# Patient Record
Sex: Female | Born: 1950 | Race: White | Hispanic: No | Marital: Married | Smoking: Former smoker
Health system: Southern US, Community
[De-identification: ages and names within clinical notes are randomized; demographics above are authoritative.]

## PROBLEM LIST (undated history)

## (undated) DIAGNOSIS — I499 Cardiac arrhythmia, unspecified: Secondary | ICD-10-CM

## (undated) DIAGNOSIS — I10 Essential (primary) hypertension: Secondary | ICD-10-CM

## (undated) DIAGNOSIS — J449 Chronic obstructive pulmonary disease, unspecified: Secondary | ICD-10-CM

## (undated) DIAGNOSIS — M1990 Unspecified osteoarthritis, unspecified site: Secondary | ICD-10-CM

## (undated) DIAGNOSIS — K219 Gastro-esophageal reflux disease without esophagitis: Secondary | ICD-10-CM

## (undated) DIAGNOSIS — M722 Plantar fascial fibromatosis: Secondary | ICD-10-CM

## (undated) DIAGNOSIS — C801 Malignant (primary) neoplasm, unspecified: Secondary | ICD-10-CM

## (undated) HISTORY — PX: TONSILLECTOMY: SUR1361

## (undated) HISTORY — PX: COLONOSCOPY: SHX174

## (undated) HISTORY — DX: Plantar fascial fibromatosis: M72.2

---

## 1999-01-07 ENCOUNTER — Other Ambulatory Visit: Admission: RE | Admit: 1999-01-07 | Discharge: 1999-01-07 | Payer: Self-pay | Admitting: Family Medicine

## 1999-02-21 ENCOUNTER — Ambulatory Visit (HOSPITAL_COMMUNITY): Admission: RE | Admit: 1999-02-21 | Discharge: 1999-02-21 | Payer: Self-pay | Admitting: Family Medicine

## 1999-02-21 ENCOUNTER — Encounter: Payer: Self-pay | Admitting: Family Medicine

## 2001-03-23 ENCOUNTER — Encounter (INDEPENDENT_AMBULATORY_CARE_PROVIDER_SITE_OTHER): Payer: Self-pay | Admitting: *Deleted

## 2001-03-23 ENCOUNTER — Ambulatory Visit (HOSPITAL_BASED_OUTPATIENT_CLINIC_OR_DEPARTMENT_OTHER): Admission: RE | Admit: 2001-03-23 | Discharge: 2001-03-23 | Payer: Self-pay | Admitting: *Deleted

## 2002-01-31 ENCOUNTER — Ambulatory Visit (HOSPITAL_BASED_OUTPATIENT_CLINIC_OR_DEPARTMENT_OTHER): Admission: RE | Admit: 2002-01-31 | Discharge: 2002-01-31 | Payer: Self-pay | Admitting: Plastic Surgery

## 2004-01-02 ENCOUNTER — Other Ambulatory Visit: Admission: RE | Admit: 2004-01-02 | Discharge: 2004-01-02 | Payer: Self-pay | Admitting: Family Medicine

## 2004-05-21 ENCOUNTER — Ambulatory Visit (HOSPITAL_COMMUNITY): Admission: RE | Admit: 2004-05-21 | Discharge: 2004-05-21 | Payer: Self-pay | Admitting: Family Medicine

## 2005-01-21 ENCOUNTER — Ambulatory Visit (HOSPITAL_COMMUNITY): Admission: RE | Admit: 2005-01-21 | Discharge: 2005-01-21 | Payer: Self-pay | Admitting: Gastroenterology

## 2005-01-21 ENCOUNTER — Encounter (INDEPENDENT_AMBULATORY_CARE_PROVIDER_SITE_OTHER): Payer: Self-pay | Admitting: Specialist

## 2005-03-31 ENCOUNTER — Other Ambulatory Visit: Admission: RE | Admit: 2005-03-31 | Discharge: 2005-03-31 | Payer: Self-pay | Admitting: Family Medicine

## 2005-05-26 ENCOUNTER — Ambulatory Visit (HOSPITAL_COMMUNITY): Admission: RE | Admit: 2005-05-26 | Discharge: 2005-05-26 | Payer: Self-pay | Admitting: Endocrinology

## 2006-04-25 ENCOUNTER — Emergency Department (HOSPITAL_COMMUNITY): Admission: EM | Admit: 2006-04-25 | Discharge: 2006-04-25 | Payer: Self-pay | Admitting: Family Medicine

## 2006-06-22 ENCOUNTER — Other Ambulatory Visit: Admission: RE | Admit: 2006-06-22 | Discharge: 2006-06-22 | Payer: Self-pay | Admitting: Family Medicine

## 2006-08-05 ENCOUNTER — Ambulatory Visit (HOSPITAL_COMMUNITY): Admission: RE | Admit: 2006-08-05 | Discharge: 2006-08-05 | Payer: Self-pay | Admitting: Family Medicine

## 2007-09-06 ENCOUNTER — Ambulatory Visit (HOSPITAL_COMMUNITY): Admission: RE | Admit: 2007-09-06 | Discharge: 2007-09-06 | Payer: Self-pay | Admitting: Family Medicine

## 2007-10-05 ENCOUNTER — Other Ambulatory Visit: Admission: RE | Admit: 2007-10-05 | Discharge: 2007-10-05 | Payer: Self-pay | Admitting: Family Medicine

## 2008-09-06 ENCOUNTER — Ambulatory Visit (HOSPITAL_COMMUNITY): Admission: RE | Admit: 2008-09-06 | Discharge: 2008-09-06 | Payer: Self-pay | Admitting: Family Medicine

## 2008-10-09 ENCOUNTER — Other Ambulatory Visit: Admission: RE | Admit: 2008-10-09 | Discharge: 2008-10-09 | Payer: Self-pay | Admitting: Family Medicine

## 2009-06-18 ENCOUNTER — Ambulatory Visit (HOSPITAL_BASED_OUTPATIENT_CLINIC_OR_DEPARTMENT_OTHER): Admission: RE | Admit: 2009-06-18 | Discharge: 2009-06-18 | Payer: Self-pay | Admitting: Plastic Surgery

## 2009-10-17 ENCOUNTER — Ambulatory Visit (HOSPITAL_COMMUNITY): Admission: RE | Admit: 2009-10-17 | Discharge: 2009-10-17 | Payer: Self-pay | Admitting: Family Medicine

## 2009-12-12 ENCOUNTER — Other Ambulatory Visit: Admission: RE | Admit: 2009-12-12 | Discharge: 2009-12-12 | Payer: Self-pay | Admitting: Family Medicine

## 2010-02-10 HISTORY — PX: MENISCUS REPAIR: SHX5179

## 2010-04-30 LAB — POCT I-STAT, CHEM 8
Calcium, Ion: 1.15 mmol/L (ref 1.12–1.32)
Creatinine, Ser: 1 mg/dL (ref 0.4–1.2)
Glucose, Bld: 95 mg/dL (ref 70–99)
HCT: 49 % — ABNORMAL HIGH (ref 36.0–46.0)
Hemoglobin: 16.7 g/dL — ABNORMAL HIGH (ref 12.0–15.0)
Potassium: 3.8 mEq/L (ref 3.5–5.1)

## 2010-06-28 NOTE — Op Note (Signed)
Alicia Hogan, Alicia Hogan                 ACCOUNT NO.:  [id number]   MEDICAL RECORD NO.:  [id number]          PATIENT TYPE:  AMB   LOCATION:  ENDO                         FACILITY:  [facility]   PHYSICIAN:  Bernette Redbird, M.D.   DATE OF BIRTH:  08/17/1950   DATE OF PROCEDURE:  01/21/2005  DATE OF DISCHARGE:                                 OPERATIVE REPORT   PROCEDURE:  Colonoscopy with biopsies.   INDICATIONS:  A 60 year old female for initial colon cancer screening. No  worrisome risk factors or symptoms.   FINDINGS:  Diminutive rectal polyps. Scattered diverticulosis.   PROCEDURE:  The nature, purpose and risks of the procedure had been reviewed  with the patient by our open access program but I also reviewed the risks  with the patient prior to the procedure and she provided written consent.  Sedation was fentanyl 100 mcg and Versed 10 mg IV without arrhythmias or  desaturation. The Olympus adjustable tension pediatric video colonoscope was  advanced around a somewhat angulated sigmoid region without too much  difficulty, and then around the remainder of colon with the help of some  external abdominal compression to control looping.  The terminal ileum was  entered for short distance and appeared normal. The ileal cecal valve and  appendiceal orifice were also identified. Pullback was then performed. The  quality of the prep was excellent and it is felt that all areas were well  seen.   In the distal rectum were several (approximately four) tiny 2-3 mm sessile  hyperplastic-appearing polyps, removed by cold biopsy. No other polyps were  seen and there was no evidence of cancer, colitis or vascular malformations.  Mild to moderate scattered diverticulosis was present, predominantly in the  sigmoid region but also in the proximal colon.   Retroflexion in the rectum was performed, although the very distal most  portion of the rectum could not be visualized due to an interposed valve of  [place]. Reinspection of the rectum showed no additional abnormalities. The  patient tolerated the procedure well and there no apparent complications.   IMPRESSION:  1.  Small rectal polyps removed as described above (211.4).  2.  Mild to moderate diverticulosis.   PLAN:  Await pathology results.           ______________________________  Bernette Redbird, M.D.    RB/MEDQ  D:  01/21/2005  T:  01/22/2005  Job:  [id number]   cc:   Jethro Bastos, M.D.  Fax: [phone]

## 2010-06-28 NOTE — Op Note (Signed)
[place]. [facility]  Patient:    YANELI, KEITHLEY Visit Number: [id number] MRN: [id number]          Service Type: DSU Location: [facility] Attending Physician:  Vikki Ports. Dictated by:   Catalina Lunger, M.D. Proc. Date: 03/23/01 Admit Date:  03/23/2001                             Operative Report  PREOPERATIVE DIAGNOSIS:  Left groin sebaceous cyst and sinus.  POSTOPERATIVE DIAGNOSIS:  Left groin sebaceous cyst and sinus.  PROCEDURE:  Wide excision of the sebaceous cyst and sinus of the left groin.  ANESTHESIA:  Local MAC.  SURGEON:  Catalina Lunger, M.D.  DESCRIPTION OF PROCEDURE:  The patient was taken to the operating room and placed in the supine position.  After adequate anesthesia was induced using MAC technique, the left groin was prepped and draped in the usual sterile fashion.  Using 1% lidocaine local anesthesia, the skin surrounding both sinus openings as well as the sebaceous cyst was anesthetized.  Elliptical incision was made through the skin and dissected down to the subcutaneous tissue removing all tissue posterior to the sinus openings as well as to the sebaceous cyst.  This took down to normal healthy subcutaneous fat.  Adequate hemostasis was assured and the skin was closed with interrupted 3-0 nylon sutures.  A sterile dressing was applied.  The patient tolerated the procedure well and went to PACU in good condition. Dictated by:   Catalina Lunger, M.D. Attending Physician:  Danna Hefty R. DD:  03/23/01 TD:  03/23/01 Job: [id number]

## 2010-06-28 NOTE — Op Note (Signed)
   NAME:  Alicia Hogan, Alicia Hogan                           ACCOUNT NO.:  [id number]   MEDICAL RECORD NO.:  [id number]                   PATIENT TYPE:  AMB   LOCATION:  DSC                                  FACILITY:  [facility]   PHYSICIAN:  Etter Sjogren, M.D.                  DATE OF BIRTH:  Jul 29, 1950   DATE OF PROCEDURE:  01/31/2002  DATE OF DISCHARGE:                                 OPERATIVE REPORT   PREOPERATIVE DIAGNOSIS:  Soft tissue lesion, greater than 1 cm, undetermined  behavior, left cheek.   POSTOPERATIVE DIAGNOSES:  1. Soft tissue lesion, greater than 1 cm, undetermined behavior, left cheek.  2. Complicated wound, left cheek, 2.0 cm.   OPERATION PERFORMED:  1. Excision, soft tissue lesion of undetermined behavior, left cheek,     greater than 1.0 cm.  2. Complex wound closure, left cheek. 2.0 cm.   SURGEON:  Etter Sjogren, M.D.   ANESTHESIA:  One percent Xylocaine with epinephrine plus bicarb.   CLINICAL NOTE:  A 60 year old woman had an infected cyst in her left cheek  several weeks ago. This appeared to be a cystic lesion that had been present  for some time.  It suddenly became very inflamed and then drained purulence.  She was placed on antibiotics.  This all subsequently cleared.  She now  presents for a definitive excision.  This procedure and the risks were  understood by her including the possibility of recurrence and infection;  and, she understood all this and also understood the scarring and the  possibility that this would be unacceptable scarring that might require scar  revision.  She wished to procedure.   DESCRIPTION OF OPERATION:  The patient was brought to the operating room and  placed supine.  The elliptical excision was marked in the direction of the  skin tension lines with the patient smiling.  She was then prepped with  Betadine and draped with sterile drapes.  Successful local anesthesia was  achieved and the elliptical excision was performed.  The  wound was cleaned  sterilely snd excellent hemostasis was noted.  We closed it in layers with 4-  0 Vicryl inverted deep sutures, 4-0 Vicryl intradermal and 6-0 Prolene  simple running sutures.  Antibiotic ointment and dry sterile dressing were  applied.   She tolerated the procedure well.   DISPOSITION:  We will see her back next week for suture removal.                                                 Etter Sjogren, M.D.    DB/MEDQ  D:  01/31/2002  T:  01/31/2002  Job:  [id number]

## 2010-09-20 ENCOUNTER — Other Ambulatory Visit (HOSPITAL_COMMUNITY): Payer: Self-pay | Admitting: Orthopaedic Surgery

## 2010-09-20 DIAGNOSIS — R531 Weakness: Secondary | ICD-10-CM

## 2010-09-20 DIAGNOSIS — M25561 Pain in right knee: Secondary | ICD-10-CM

## 2010-09-21 ENCOUNTER — Ambulatory Visit (HOSPITAL_COMMUNITY)
Admission: RE | Admit: 2010-09-21 | Discharge: 2010-09-21 | Disposition: A | Discharge status: Home / Self Care | Payer: 59 | Source: Ambulatory Visit | Attending: Orthopaedic Surgery | Admitting: Orthopaedic Surgery

## 2010-09-21 ENCOUNTER — Other Ambulatory Visit (HOSPITAL_COMMUNITY): Payer: Self-pay | Admitting: Orthopaedic Surgery

## 2010-09-21 DIAGNOSIS — M25561 Pain in right knee: Secondary | ICD-10-CM

## 2010-09-21 DIAGNOSIS — S83289A Other tear of lateral meniscus, current injury, unspecified knee, initial encounter: Secondary | ICD-10-CM | POA: Insufficient documentation

## 2010-09-21 DIAGNOSIS — R531 Weakness: Secondary | ICD-10-CM

## 2010-09-21 DIAGNOSIS — X58XXXA Exposure to other specified factors, initial encounter: Secondary | ICD-10-CM | POA: Insufficient documentation

## 2010-09-21 DIAGNOSIS — IMO0002 Reserved for concepts with insufficient information to code with codable children: Secondary | ICD-10-CM | POA: Insufficient documentation

## 2010-09-23 ENCOUNTER — Other Ambulatory Visit (HOSPITAL_COMMUNITY): Payer: Self-pay

## 2010-09-23 ENCOUNTER — Inpatient Hospital Stay (HOSPITAL_COMMUNITY)
Admission: RE | Admit: 2010-09-23 | Discharge: 2010-09-23 | Payer: Self-pay | Source: Ambulatory Visit | Attending: Orthopaedic Surgery | Admitting: Orthopaedic Surgery

## 2010-10-28 ENCOUNTER — Encounter (HOSPITAL_BASED_OUTPATIENT_CLINIC_OR_DEPARTMENT_OTHER)
Admission: RE | Admit: 2010-10-28 | Discharge: 2010-10-28 | Disposition: A | Discharge status: Home / Self Care | Payer: 59 | Source: Ambulatory Visit | Attending: Orthopaedic Surgery | Admitting: Orthopaedic Surgery

## 2010-10-28 LAB — BASIC METABOLIC PANEL
BUN: 14 mg/dL (ref 6–23)
CO2: 27 mEq/L (ref 19–32)
Calcium: 9.8 mg/dL (ref 8.4–10.5)
Chloride: 105 mEq/L (ref 96–112)
Creatinine, Ser: 0.88 mg/dL (ref 0.50–1.10)

## 2010-10-29 ENCOUNTER — Ambulatory Visit (HOSPITAL_BASED_OUTPATIENT_CLINIC_OR_DEPARTMENT_OTHER)
Admission: RE | Admit: 2010-10-29 | Discharge: 2010-10-29 | Disposition: A | Discharge status: Home / Self Care | Payer: 59 | Source: Ambulatory Visit | Attending: Orthopaedic Surgery | Admitting: Orthopaedic Surgery

## 2010-10-29 DIAGNOSIS — M23305 Other meniscus derangements, unspecified medial meniscus, unspecified knee: Secondary | ICD-10-CM | POA: Insufficient documentation

## 2010-10-29 DIAGNOSIS — Z01812 Encounter for preprocedural laboratory examination: Secondary | ICD-10-CM | POA: Insufficient documentation

## 2010-10-29 DIAGNOSIS — M2240 Chondromalacia patellae, unspecified knee: Secondary | ICD-10-CM | POA: Insufficient documentation

## 2010-10-29 DIAGNOSIS — M23302 Other meniscus derangements, unspecified lateral meniscus, unspecified knee: Secondary | ICD-10-CM | POA: Insufficient documentation

## 2010-10-29 DIAGNOSIS — Z01810 Encounter for preprocedural cardiovascular examination: Secondary | ICD-10-CM | POA: Insufficient documentation

## 2010-10-29 DIAGNOSIS — I10 Essential (primary) hypertension: Secondary | ICD-10-CM | POA: Insufficient documentation

## 2010-11-01 NOTE — Op Note (Signed)
NAMEHARBOUR, NORDMEYER NO.:  [id number]  MEDICAL RECORD NO.:  [id number]  LOCATION:                                 FACILITY:  PHYSICIAN:  Lubertha Basque. Zaydon Kinser, M.D.DATE OF BIRTH:  [birth year]  DATE OF PROCEDURE:  10/29/2010 DATE OF DISCHARGE:                              OPERATIVE REPORT   PREOPERATIVE DIAGNOSES: 1. Left knee torn medial meniscus. 2. Left knee chondromalacia.  POSTOPERATIVE DIAGNOSES: 1. Left knee torn medial and torn lateral meniscus. 2. Left knee chondromalacia.  PROCEDURES: 1. Left knee partial medial and partial lateral meniscectomy. 2. Left knee abrasion chondroplasty.  ANESTHESIA:  General.  ATTENDING SURGEON:  Lubertha Basque. Jerl Santos, MD  ASSISTANT:  Lindwood Qua, PA   INDICATIONS FOR PROCEDURE:  The patient is a 60 year old woman with a long history of left knee pain.  This has persisted despite multiple conservative measures.  She had an MRI scan which shows a medial meniscus tear.  She has pain which limits her ability to rest and work and she is offered an arthroscopy.  Informed operative consent was obtained after discussion of the possible complications including reaction to anesthesia and infection.  SUMMARY OF FINDINGS AND PROCEDURE:  Under general anesthesia, left knee arthroscopy was performed.  The suprapatellar pouch was benign while the patellofemoral joint exhibited breakdown at the apex of the patella addressed with abrasion to bleeding bone in one tiny area at the apex of the patella.  The intertrochlear groove appeared relatively benign. The medial compartment exhibited a turned under medial meniscus tear addressed with about 20% partial medial meniscectomy.  She also had some grade 3 change on the medial femoral condyle and a thorough chondroplasty was done.  The ACL was intact.  The lateral compartment exhibited a small free edge tear addressed with about 5% partial lateral meniscectomy.  She had a dime sized  area of bare bone in the lateral compartment addressed with chondroplasty of loose cartilage around the borders of this lesion.  DESCRIPTION OF PROCEDURE:  The patient was taken to the operating suite where general anesthetic was applied without difficulty.  She was positioned supine and prepped and draped in normal sterile fashion. After administration of IV Kefzol, an arthroscopy of the left knee was performed through a total of two portals.  Findings were as noted above and procedure consisted of the medial and lateral meniscectomies done with baskets and shavers.  This was filed with the abrasion chondroplasty of the patellofemoral and the standard chondroplasty medial and lateral.  The knee was thoroughly irrigated followed by placement of a Marcaine with epinephrine and morphine plus Depo-Medrol. Adaptic was placed over the portals, followed by dry gauze and a loose Ace wrap.  Estimated blood loss and intraoperative fluids can be obtained from anesthesia records.  DISPOSITION:  The patient was extubated in the operating room and taken to recovery room in stable addition.  She was to go home the same-day and follow up in the office in less than a week.  I will contact her by phone tonight.     Lubertha Basque Jerl Santos, M.D.     PGD/MEDQ  D:  10/29/2010  T:  10/29/2010  Job:  [id number]  Electronically Signed by Marcene Corning M.D. on 11/01/2010 12:31:27 PM

## 2010-11-07 ENCOUNTER — Ambulatory Visit: Payer: 59 | Attending: Orthopaedic Surgery

## 2010-11-07 DIAGNOSIS — R269 Unspecified abnormalities of gait and mobility: Secondary | ICD-10-CM | POA: Insufficient documentation

## 2010-11-07 DIAGNOSIS — M25669 Stiffness of unspecified knee, not elsewhere classified: Secondary | ICD-10-CM | POA: Insufficient documentation

## 2010-11-07 DIAGNOSIS — R262 Difficulty in walking, not elsewhere classified: Secondary | ICD-10-CM | POA: Insufficient documentation

## 2010-11-07 DIAGNOSIS — IMO0001 Reserved for inherently not codable concepts without codable children: Secondary | ICD-10-CM | POA: Insufficient documentation

## 2010-11-07 DIAGNOSIS — M25569 Pain in unspecified knee: Secondary | ICD-10-CM | POA: Insufficient documentation

## 2010-11-08 ENCOUNTER — Ambulatory Visit: Payer: 59

## 2010-11-08 ENCOUNTER — Other Ambulatory Visit (HOSPITAL_COMMUNITY): Payer: Self-pay | Admitting: Family Medicine

## 2010-11-08 DIAGNOSIS — Z1231 Encounter for screening mammogram for malignant neoplasm of breast: Secondary | ICD-10-CM

## 2010-11-11 ENCOUNTER — Ambulatory Visit: Payer: 59 | Attending: Orthopaedic Surgery

## 2010-11-11 DIAGNOSIS — R262 Difficulty in walking, not elsewhere classified: Secondary | ICD-10-CM | POA: Insufficient documentation

## 2010-11-11 DIAGNOSIS — M25669 Stiffness of unspecified knee, not elsewhere classified: Secondary | ICD-10-CM | POA: Insufficient documentation

## 2010-11-11 DIAGNOSIS — R269 Unspecified abnormalities of gait and mobility: Secondary | ICD-10-CM | POA: Insufficient documentation

## 2010-11-11 DIAGNOSIS — IMO0001 Reserved for inherently not codable concepts without codable children: Secondary | ICD-10-CM | POA: Insufficient documentation

## 2010-11-11 DIAGNOSIS — M25569 Pain in unspecified knee: Secondary | ICD-10-CM | POA: Insufficient documentation

## 2010-11-13 ENCOUNTER — Ambulatory Visit: Payer: 59

## 2010-11-14 ENCOUNTER — Ambulatory Visit (HOSPITAL_COMMUNITY)
Admission: RE | Admit: 2010-11-14 | Discharge: 2010-11-14 | Disposition: A | Discharge status: Home / Self Care | Payer: 59 | Source: Ambulatory Visit | Attending: Family Medicine | Admitting: Family Medicine

## 2010-11-14 DIAGNOSIS — Z1231 Encounter for screening mammogram for malignant neoplasm of breast: Secondary | ICD-10-CM | POA: Insufficient documentation

## 2010-11-15 ENCOUNTER — Ambulatory Visit: Payer: 59

## 2010-11-26 ENCOUNTER — Ambulatory Visit: Payer: 59

## 2011-01-14 ENCOUNTER — Other Ambulatory Visit: Payer: Self-pay | Admitting: Gastroenterology

## 2011-04-15 ENCOUNTER — Other Ambulatory Visit: Payer: Self-pay | Admitting: Dermatology

## 2011-11-04 ENCOUNTER — Ambulatory Visit (INDEPENDENT_AMBULATORY_CARE_PROVIDER_SITE_OTHER): Payer: 59 | Admitting: Sports Medicine

## 2011-11-04 VITALS — BP 140/78 | Ht 67.0 in | Wt 218.0 lb

## 2011-11-04 DIAGNOSIS — M722 Plantar fascial fibromatosis: Secondary | ICD-10-CM

## 2011-11-04 DIAGNOSIS — M79673 Pain in unspecified foot: Secondary | ICD-10-CM

## 2011-11-04 DIAGNOSIS — M79609 Pain in unspecified limb: Secondary | ICD-10-CM

## 2011-11-04 NOTE — Progress Notes (Signed)
  Subjective:    Patient ID: Alicia Hogan, female    DOB: 09-09-1950, 61 y.o.   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 She is a Engineer, structural and began to experience pain acutely after dancing one evening. Her pain is intermittent but at times debilitating. Initially it was along the medial aspect of her heel but has now moved more laterally. Worse with weightbearing particularly at the end of the day. She's not noticed any swelling. She's tried several different gel inserts in her shoes. She's also tried a night splint. No history of previous plantar fasciitis. She did have a left knee arthroscopy last year and she wonders whether or not that may have contributed to her right heel problems. No prior surgeries to this foot in the past. She denies numbness and tingling.  Medical history is positive for hypertension, hypercholesterolemia, and osteoarthritis Medications include losartan, lovastatin, ibuprofen when necessary, and a multivitamin She is allergic to codeine Socially she is a former smoker having quit in 2009, denies alcohol use, and works as an Charity fundraiser for [employer]    Review of Systems as above     Objective:   Physical Exam Well-developed, well-nourished. No acute distress. Awake alert and oriented x3. Vital signs are reviewed  Right heel: There is tenderness to palpation at the calcaneal insertion of the plantar fascia. Some tenderness along the lateral column as well. No soft tissue swelling. Negative calcaneal squeeze. Skin is intact without erythema. She has a slightly flexible cavus foot. Neurovascularly intact distally. Walks with a slight limp.  [facility] ultrasound of both the right and left plantar fascia were performed. There is significant thickening of the right plantar fascia when compared to the left. Right plantar fascia measures 0.73 cm and left plantar fascia  measures 0.48 cm. There also appears to be a calcaneal spur on the right. I do not appreciate any increased neovascularity. No evidence of cortical irregularity in the calcaneus to suggest stress fracture.       Assessment & Plan:  1. Right heel pain secondary to plantar fasciitis  Patient is fitted with green insoles for cushioning. She will start plantar fascial stretches and eccentric strengthening in the form of a home exercise program. Daily ice baths. Arch strap. Followup in 4 weeks. If symptoms persist or worsen we could consider merits of cortisone injection.

## 2011-12-03 ENCOUNTER — Ambulatory Visit (INDEPENDENT_AMBULATORY_CARE_PROVIDER_SITE_OTHER): Payer: 59 | Admitting: Sports Medicine

## 2011-12-03 VITALS — BP 130/82 | Ht 68.0 in | Wt 210.0 lb

## 2011-12-03 DIAGNOSIS — M722 Plantar fascial fibromatosis: Secondary | ICD-10-CM

## 2011-12-03 NOTE — Progress Notes (Signed)
  Subjective:    Patient ID: Alicia Hogan, female    DOB: 1951-02-04, 61 y.o.   MRN: [id number]  HPI Patient comes in today for followup on plantar fasciitis of her right foot. Overall she feels 10% better. She has been doing her home exercises and wearing her green sports insoles. Her main complaint today is pain along the metatarsal area of the plantar aspect of her right foot. This may be from doing her heel drops. She has been performing her plantar fascial stretches and daily icing. She localizes most of the pain along the lateral aspect of her foot. MSK ultrasound at last visit showed the plantar fascia to be enlarged at 0.73 cm    Review of Systems     Objective:   Physical Exam Well-developed, well-nourished. No acute distress. Awake alert and oriented x3  Right foot: Mild tenderness to palpation at the calcaneal insertion of the plantar fascia. She is tender to palpation along the lateral aspect of the heel and midfoot but no soft tissue swelling. No tenderness along the peroneal tendons. Mild tenderness to palpation across the metatarsal heads but not marked. She walks without a significant limp.      Assessment & Plan:  Plantar fasciitis right foot  I've added scaphoid pad and metatarsal pads to her green sports insole and I've asked that she continue with her home exercise program and daily icing. I have reiterated to her the fact that he takes this condition several months to resolve and she understands. She can continue with activity as tolerated and will followup with me in 4 weeks. At some point I think she would benefit from custom orthotics. She will call me with questions or concerns prior to her followup visit.

## 2011-12-31 ENCOUNTER — Ambulatory Visit: Payer: 59 | Admitting: Sports Medicine

## 2012-04-06 ENCOUNTER — Other Ambulatory Visit (HOSPITAL_COMMUNITY): Payer: Self-pay | Admitting: Family Medicine

## 2012-04-06 DIAGNOSIS — Z1231 Encounter for screening mammogram for malignant neoplasm of breast: Secondary | ICD-10-CM

## 2012-04-14 ENCOUNTER — Ambulatory Visit (HOSPITAL_COMMUNITY)
Admission: RE | Admit: 2012-04-14 | Discharge: 2012-04-14 | Disposition: A | Discharge status: Home / Self Care | Payer: 59 | Source: Ambulatory Visit | Attending: Family Medicine | Admitting: Family Medicine

## 2012-04-14 DIAGNOSIS — Z1231 Encounter for screening mammogram for malignant neoplasm of breast: Secondary | ICD-10-CM

## 2012-08-03 ENCOUNTER — Ambulatory Visit (INDEPENDENT_AMBULATORY_CARE_PROVIDER_SITE_OTHER): Payer: 59 | Admitting: Podiatry

## 2012-08-03 VITALS — BP 164/94 | HR 82

## 2012-08-03 DIAGNOSIS — M21969 Unspecified acquired deformity of unspecified lower leg: Secondary | ICD-10-CM

## 2012-08-03 DIAGNOSIS — M722 Plantar fascial fibromatosis: Secondary | ICD-10-CM

## 2012-08-03 DIAGNOSIS — M216X9 Other acquired deformities of unspecified foot: Secondary | ICD-10-CM | POA: Insufficient documentation

## 2012-08-03 MED ORDER — DICLOFENAC EPOLAMINE 1.3 % TD PTCH: 1.0000 | MEDICATED_PATCH | Freq: Two times a day (BID) | TRANSDERMAL | Status: DC

## 2012-08-03 MED ORDER — ARCH BANDAGE MISC: 2.0000 | Freq: Once | Status: DC

## 2012-08-03 NOTE — Progress Notes (Signed)
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 Initially started on right plantar lateral heel and now moved up to mid arch area and whole bottom of the foot hurts. Pain started about a year ago. Now left foot start hurting for the past 2 months.  Pain wakes her up. She is very tired of having this pain so long without any improvement. She's been treated by several doctors, ultrasound, NSAIA, physical therapy, change in shoe gears with built up on arch and padding on bottom. She had a Night Splint that was too difficult to keep at night and could not continue using it. Pain is bad first thing in the morning and at the end of the day. She wears dress shoes for ballroom dancing every 2 weeks.   Objective: High arch cavus foot bilateral. Pain at the heel R>L.  Elevated first ray bilateral. Tight Achilles tendon bilateral with knee extension and flexion. Neurovascular status are within normal. X-ray: Cavus type foot, positive plantar calcaneal spur, Elevated first Metatarsal is not significant in lateral view.  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 Ankle equinus bilateral. Elevated first Metatarsal bilateral. Compensatory Subtalar joint hyperpronation bilateral.  Plan: Reviewed clinical findings and available treatment options. 1. Patient is to keep up with stretch exercise for tight Achilles tendon (Patient stated she knows how to do stretch exercise). 2. Different style of Night Splint dispensed to wear at night. 3. Metatarsal binder dispensed to stabilize mid foot during ambulation. 4. Both feet casted for Orthotics. 5. Flector patch prescribed in place of injection since affected area was wide plantar surface of right foot (ball, arch and heel).

## 2012-08-30 ENCOUNTER — Encounter: Payer: Self-pay | Admitting: Podiatry

## 2012-09-29 ENCOUNTER — Ambulatory Visit (INDEPENDENT_AMBULATORY_CARE_PROVIDER_SITE_OTHER): Payer: 59 | Admitting: Podiatry

## 2012-09-29 DIAGNOSIS — M216X9 Other acquired deformities of unspecified foot: Secondary | ICD-10-CM

## 2012-09-29 DIAGNOSIS — M21969 Unspecified acquired deformity of unspecified lower leg: Secondary | ICD-10-CM

## 2012-09-29 DIAGNOSIS — M722 Plantar fascial fibromatosis: Secondary | ICD-10-CM

## 2012-09-29 NOTE — Progress Notes (Signed)
One month orthotic follow up. Overall doing well except the balls of both feet still tender.  Assessment: Metatarsus primus elevatus bilateral. Bilateral heel spur. STJ hyperpronation bilateral. Plan: Added temporary metatarsal pad. If helps will add permanent pad. Continue to stretch Achilles tendon stretch, use night splint and metatarsal binder as needed.

## 2012-10-26 ENCOUNTER — Other Ambulatory Visit (HOSPITAL_COMMUNITY)
Admission: RE | Admit: 2012-10-26 | Discharge: 2012-10-26 | Disposition: A | Discharge status: Home / Self Care | Payer: 59 | Source: Ambulatory Visit | Attending: Family Medicine | Admitting: Family Medicine

## 2012-10-26 ENCOUNTER — Other Ambulatory Visit: Payer: Self-pay | Admitting: Family Medicine

## 2012-10-26 DIAGNOSIS — Z124 Encounter for screening for malignant neoplasm of cervix: Secondary | ICD-10-CM | POA: Insufficient documentation

## 2012-12-16 ENCOUNTER — Other Ambulatory Visit: Payer: Self-pay

## 2013-05-01 ENCOUNTER — Emergency Department (HOSPITAL_COMMUNITY)
Admission: EM | Admit: 2013-05-01 | Discharge: 2013-05-01 | Disposition: A | Discharge status: Home / Self Care | Payer: 59 | Source: Home / Self Care | Attending: Emergency Medicine | Admitting: Emergency Medicine

## 2013-05-01 ENCOUNTER — Encounter (HOSPITAL_COMMUNITY): Payer: Self-pay | Admitting: Emergency Medicine

## 2013-05-01 ENCOUNTER — Emergency Department (INDEPENDENT_AMBULATORY_CARE_PROVIDER_SITE_OTHER): Payer: 59

## 2013-05-01 DIAGNOSIS — I10 Essential (primary) hypertension: Secondary | ICD-10-CM | POA: Insufficient documentation

## 2013-05-01 DIAGNOSIS — S2239XA Fracture of one rib, unspecified side, initial encounter for closed fracture: Secondary | ICD-10-CM

## 2013-05-01 HISTORY — DX: Essential (primary) hypertension: I10

## 2013-05-01 MED ORDER — HYDROCODONE-ACETAMINOPHEN 5-325 MG PO TABS: ORAL_TABLET | ORAL | Status: DC

## 2013-05-01 NOTE — ED Provider Notes (Signed)
Chief Complaint   Chief Complaint  Patient presents with  . 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 Last night she was walking to her truck in the parking lot of the hospital when she tripped and fell, landing on her right side. She did not hit her head and there was no loss of consciousness. Her biggest complaint has been pain in the right, anterolateral rib cage area. This hurts with pressure, hurts with any movement or coughing but does not hurt with deep inspiration. She denies any shortness of breath, wheezing, or hemoptysis. She's had no dizziness or presyncope. She also has a small abrasion on the palm of her left hand, and a bruise on her right hip. She is ambulatory. She denies any abdominal pain.  Review of Systems   Other than as noted above, the patient denies any of the following symptoms: ENT:  No headache, facial pain, or bleeding from the nose or ears.  No loose or broken teeth. Neck:  No neck pain or stiffnes. Cardiac:  No chest pain. No palpitations, dizziness, syncope or fainting. GI:  No abdominal pain. No nausea, vomiting, or diarrhea. M-S:  No extremity pain, swelling, bruising, limited ROM, or back pain. Neuro:  No loss of consciousness, seizure activity, dizziness, vertigo, paresthesias, numbness, or weakness.  No difficulty with speech or ambulation.  [place]   Past medical history, family history, social history, meds, and allergies were reviewed.  She is allergic to codeine but is able to take hydrocodone. She has plantar fasciitis. She takes losartan for high blood pressure and lovastatin for elevated cholesterol.  Physical Examination    Vital signs:  BP 166/94  Pulse 90  Temp(Src) 97.9 F (36.6 C) (Oral)  Resp 16  SpO2 100% General:  Alert, oriented and in no distress. Eye:  PERRL, full EOMs. ENT:  No cranial or facial tenderness to palpation. Neck:  No tenderness to palpation.   Full ROM without pain. Heart:  Regular rhythm.  No extrasystoles, gallops, or murmers. Lungs:  There is chest wall tenderness to palpation over the right, lower, anterolateral chest area without swelling, bruising, or deformity. Breath sounds clear and equal bilaterally.  No wheezes, rales or rhonchi. Abdomen:  Non tender. Back:  Non tender to palpation.  Full ROM without pain. Extremities:  There is a small abrasion on the palm of her left hand. She has a moderately-sized tender hematoma on her right lateral hip. The hip joint itself has a full range of motion without any pain.  Full ROM of all joints without pain.  Pulses full.  Brisk capillary refill. Neuro:  Alert and oriented times 3.  Cranial nerves intact.  No muscle weakness.  Sensation intact to light touch.  Gait normal. Skin:  No bruising, abrasions, or lacerations.  Radiology   Dg Ribs Unilateral W/chest Right  05/01/2013   CLINICAL DATA:  [place].  Right-sided chest pain.  EXAM: RIGHT RIBS AND CHEST - 3+ VIEW  COMPARISON:  None.  FINDINGS: Heart size is normal. Mediastinal shadows are normal. The lungs are clear. No pneumothorax or hemothorax. Marker put in place in the region of the anterior lower right ribs. I think there is a nondisplaced fracture at the anterior aspect of the right ninth rib.  IMPRESSION: Nondisplaced fracture of the anterior right ninth rib. No pneumothorax or hemothorax.   Electronically Signed   By: Jan Fireman.D.  On: 05/01/2013 10:58   Assessment   The encounter diagnosis was Rib fracture.  Plan   1.  Meds:  The following meds were prescribed:   New Prescriptions   HYDROCODONE-ACETAMINOPHEN (NORCO/VICODIN) 5-325 MG PER TABLET    1 to 2 tabs every 4 to 6 hours as needed for pain.    2.  Patient Education/Counseling:  The patient was given appropriate handouts, self care instructions, and instructed in symptomatic relief.  She was told to avoid heavy lifting and bending, to take deep breaths and cough  several times a day, and that this would take about 6 weeks to heal up.  3.  Follow up:  The patient was told to follow up here if no better in 3 to 4 days, or sooner if becoming worse in any way, and given some red flag symptoms such as increasing pain or new neurological symptoms which would prompt immediate return.  Follow up here if necessary.     Harden Mo, MD 05/01/13 1130

## 2013-05-01 NOTE — Discharge Instructions (Signed)
Rib Fracture  A rib fracture is a break or crack in one of the bones of the ribs. The ribs are a group of long, curved bones that wrap around your chest and attach to your spine. They protect your lungs and other organs in the chest cavity. A broken or cracked rib is often painful, but most do not cause other problems. Most rib fractures heal on their own over time. However, rib fractures can be more serious if multiple ribs are broken or if broken ribs move out of place and push against other structures.  CAUSES   · A direct blow to the chest. For example, this could happen during contact sports, a car accident, or a fall against a hard object.  · Repetitive movements with high force, such as pitching a baseball or having severe coughing spells.  SYMPTOMS   · Pain when you breathe in or cough.  · Pain when someone presses on the injured area.  DIAGNOSIS   Your caregiver will perform a physical exam. Various imaging tests may be ordered to confirm the diagnosis and to look for related injuries. These tests may include a chest X-ray, computed tomography (CT), magnetic resonance imaging (MRI), or a bone scan.  TREATMENT   Rib fractures usually heal on their own in 1 3 months. The longer healing period is often associated with a continued cough or other aggravating activities. During the healing period, pain control is very important. Medication is usually given to control pain. Hospitalization or surgery may be needed for more severe injuries, such as those in which multiple ribs are broken or the ribs have moved out of place.   HOME CARE INSTRUCTIONS   · Avoid strenuous activity and any activities or movements that cause pain. Be careful during activities and avoid bumping the injured rib.  · Gradually increase activity as directed by your caregiver.  · Only take over-the-counter or prescription medications as directed by your caregiver. Do not take other medications without asking your caregiver first.  · Apply ice  to the injured area for the first 1 2 days after you have been treated or as directed by your caregiver. Applying ice helps to reduce inflammation and pain.  · Put ice in a plastic bag.  · Place a towel between your skin and the bag.    · Leave the ice on for 15 20 minutes at a time, every 2 hours while you are awake.  · Perform deep breathing as directed by your caregiver. This will help prevent pneumonia, which is a common complication of a broken rib. Your caregiver may instruct you to:  · Take deep breaths several times a day.  · Try to cough several times a day, holding a pillow against the injured area.  · Use a device called an incentive spirometer to practice deep breathing several times a day.  · Drink enough fluids to keep your urine clear or pale yellow. This will help you avoid constipation.    · Do not wear a rib belt or binder. These restrict breathing, which can lead to pneumonia.    SEEK IMMEDIATE MEDICAL CARE IF:   · You have a fever.    · You have difficulty breathing or shortness of breath.    · You develop a continual cough, or you cough up thick or bloody sputum.  · You feel sick to your stomach (nausea), throw up (vomit), or have abdominal pain.    · You have worsening pain not controlled with medications.      MAKE SURE YOU:  · Understand these instructions.  · Will watch your condition.  · Will get help right away if you are not doing well or get worse.  Document Released: 01/27/2005 Document Revised: 09/29/2012 Document Reviewed: 03/31/2012  [employer]® Patient Information ©2014 [employer].

## 2013-05-01 NOTE — ED Notes (Signed)
Patient fell last night in the parking lot while leaving work, approx 7:30 pm 04/30/13. Complains of right side rib pain.

## 2013-06-22 ENCOUNTER — Other Ambulatory Visit (HOSPITAL_COMMUNITY): Payer: Self-pay | Admitting: Family Medicine

## 2013-06-22 DIAGNOSIS — Z1231 Encounter for screening mammogram for malignant neoplasm of breast: Secondary | ICD-10-CM

## 2013-06-27 ENCOUNTER — Ambulatory Visit (HOSPITAL_COMMUNITY)
Admission: RE | Admit: 2013-06-27 | Discharge: 2013-06-27 | Disposition: A | Discharge status: Home / Self Care | Payer: 59 | Source: Ambulatory Visit | Attending: Family Medicine | Admitting: Family Medicine

## 2013-06-27 DIAGNOSIS — Z1231 Encounter for screening mammogram for malignant neoplasm of breast: Secondary | ICD-10-CM | POA: Insufficient documentation

## 2013-07-13 ENCOUNTER — Ambulatory Visit (INDEPENDENT_AMBULATORY_CARE_PROVIDER_SITE_OTHER): Payer: 59

## 2013-07-13 ENCOUNTER — Ambulatory Visit (INDEPENDENT_AMBULATORY_CARE_PROVIDER_SITE_OTHER): Payer: 59 | Admitting: Podiatry

## 2013-07-13 ENCOUNTER — Encounter: Payer: Self-pay | Admitting: Podiatry

## 2013-07-13 VITALS — BP 144/77 | HR 69 | Resp 16 | Ht 68.0 in | Wt 220.0 lb

## 2013-07-13 DIAGNOSIS — M7750 Other enthesopathy of unspecified foot: Secondary | ICD-10-CM

## 2013-07-13 DIAGNOSIS — B351 Tinea unguium: Secondary | ICD-10-CM

## 2013-07-13 DIAGNOSIS — M722 Plantar fascial fibromatosis: Secondary | ICD-10-CM

## 2013-07-13 MED ORDER — TRIAMCINOLONE ACETONIDE 10 MG/ML IJ SUSP
10.0000 mg | Freq: Once | INTRAMUSCULAR | Status: AC
  Administered 2013-07-13: 10 mg

## 2013-07-13 NOTE — Patient Instructions (Signed)

## 2013-07-13 NOTE — Progress Notes (Signed)
   Subjective:    Patient ID: Alicia Hogan, female    DOB: 04/13/1950, 63 y.o.   MRN: [id number]  HPI Comments: "Well, they tell me its plantar fasciitis"  Patient c/o aching, throbbing plantar forefoot and heel bilateral for 2 years. She is having Am pain. She works 12 hrs shifts and makes very uncomfortable. She has seen 2 orthopedists, her PCP and Dr. Caffie Pinto. Her treatments include exercises, stretching with a band, Ibuprofen, Finn Comfort shoes and NB sneakers with insoles from [employer] and heel cups.She has custom orthotics. She has not had any cortisone injections. She is very discouraged that she has not gotten any better.  Foot Pain      Review of Systems  All other systems reviewed and are negative.      Objective:   Physical Exam        Assessment & Plan:

## 2013-07-13 NOTE — Progress Notes (Signed)
Subjective:     Patient ID: Alicia Hogan, female   DOB: 13-Jan-1951, 63 y.o.   MRN: [id number]  Foot Pain   patient states she's been to several physicians and is developing a lot of pain in her feet over the last 2 years with heel pain been the worse and pain running in the forefeet of both feet with pain worse at nighttime while sleeping or when stretching her foot   Review of Systems  All other systems reviewed and are negative.      Objective:   Physical Exam  Nursing note and vitals reviewed. Constitutional: She is oriented to person, place, and time.  Cardiovascular: Intact distal pulses.   Musculoskeletal: Normal range of motion.  Neurological: She is oriented to person, place, and time.  Skin: Skin is warm.   neurovascular status intact with muscle strength adequate in range of motion subtalar midtarsal joint within normal limits. Patient is found to have discomfort in the plantar fascia of both feet with exquisite discomfort upon deep palpation and is found to have moderate forefoot discomfort of both feet around the metatarsal phalangeal joints with no midfoot pain noted. Patient's digits are well-perfused and are tight is moderately depressed     Assessment:     Probable plantar fasciitis creating change in gait cycle leading to increase forefoot instability    Plan:     H&P and x-rays reviewed. Injected the plantar fascia both feet 3 mg Kenalog 5 mg I can Marcaine mixture and instructed on physical therapy and scanned for new orthotics to put more forefoot stability and try to take stress off the arch and heel. Reappoint when orthotics returned

## 2013-08-03 ENCOUNTER — Encounter: Payer: Self-pay | Admitting: Podiatry

## 2013-08-03 ENCOUNTER — Ambulatory Visit (INDEPENDENT_AMBULATORY_CARE_PROVIDER_SITE_OTHER): Payer: 59 | Admitting: Podiatry

## 2013-08-03 DIAGNOSIS — M722 Plantar fascial fibromatosis: Secondary | ICD-10-CM

## 2013-08-03 DIAGNOSIS — M779 Enthesopathy, unspecified: Secondary | ICD-10-CM

## 2013-08-03 MED ORDER — DICLOFENAC SODIUM 75 MG PO TBEC: 75.0000 mg | DELAYED_RELEASE_TABLET | Freq: Two times a day (BID) | ORAL | Status: DC

## 2013-08-03 NOTE — Patient Instructions (Signed)

## 2013-08-04 NOTE — Progress Notes (Signed)
Subjective:     Patient ID: Alicia Hogan, female   DOB: 10/04/1950, 63 y.o.   MRN: [id number]  HPI patient presents stating that my heel is feeling quite a bit better with discomfort only if I been excessively on   Review of Systems     Objective:   Physical Exam Neurovascular status intact with no other health history changes noted and heel that upon palpation is sore with deep pressure    Assessment:     Plantar fasciitis which has improved but still sore    Plan:     Martin Majestic ahead today and discussed physical therapy supportive shoe gear and possible custom orthotic devices. Were not off and see how it does and treat as it indicate

## 2013-09-07 ENCOUNTER — Encounter: Payer: Self-pay | Admitting: Podiatry

## 2013-09-07 ENCOUNTER — Ambulatory Visit (INDEPENDENT_AMBULATORY_CARE_PROVIDER_SITE_OTHER): Payer: 59 | Admitting: Podiatry

## 2013-09-07 VITALS — BP 162/82 | HR 78 | Resp 12

## 2013-09-07 DIAGNOSIS — M722 Plantar fascial fibromatosis: Secondary | ICD-10-CM

## 2013-09-07 DIAGNOSIS — G589 Mononeuropathy, unspecified: Secondary | ICD-10-CM

## 2013-09-07 MED ORDER — GABAPENTIN 300 MG PO CAPS: 300.0000 mg | ORAL_CAPSULE | Freq: Two times a day (BID) | ORAL | Status: DC

## 2013-09-07 NOTE — Progress Notes (Signed)
Subjective:     Patient ID: Alicia Hogan, female   DOB: 1950/10/15, 63 y.o.   MRN: [id number]  HPI patient states I'm still having mild discomfort but it's more the tingling in the front part of my feet that bother me more. 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 Continue orthotics and physical therapy for plantar fasciitis

## 2013-10-19 ENCOUNTER — Encounter: Payer: Self-pay | Admitting: Podiatry

## 2013-10-19 ENCOUNTER — Ambulatory Visit (INDEPENDENT_AMBULATORY_CARE_PROVIDER_SITE_OTHER): Payer: 59 | Admitting: Podiatry

## 2013-10-19 VITALS — BP 144/75 | HR 73 | Resp 16

## 2013-10-19 DIAGNOSIS — G589 Mononeuropathy, unspecified: Secondary | ICD-10-CM

## 2013-10-19 DIAGNOSIS — M722 Plantar fascial fibromatosis: Secondary | ICD-10-CM

## 2013-10-20 NOTE — Progress Notes (Signed)
Subjective:     Patient ID: Alicia Hogan, female   DOB: [birth year], 63 y.o.   MRN: [id number]  HPI patient states that I'm doing a little bit better with my feet but it doesn't seem the medicine is quite taking away all of this stating that is present. It seems to go for my arch up to my forefoot of both feet and is worse at night   Review of Systems     Objective:   Physical Exam No change in neurovascular status with what appears to be low grade neuropathic symptoms within the arch and forefoot and minimal discomfort in the plantar heel secondary to fasciitis-like symptoms    Assessment:     Reviewed condition and at this time recommended changes in gabapentin usage    Plan:     Explained neuropathy the patient and at this time we will go 21 gabapentin during the day one at night and possibility 2 at night over the next 6 weeks. I will reevaluate to see where she stands at that time

## 2013-11-23 ENCOUNTER — Other Ambulatory Visit: Payer: Self-pay | Admitting: Plastic Surgery

## 2013-11-30 ENCOUNTER — Encounter: Payer: Self-pay | Admitting: Podiatry

## 2013-11-30 ENCOUNTER — Ambulatory Visit (INDEPENDENT_AMBULATORY_CARE_PROVIDER_SITE_OTHER): Payer: 59 | Admitting: Podiatry

## 2013-11-30 VITALS — BP 132/72 | HR 77 | Resp 16

## 2013-11-30 DIAGNOSIS — M722 Plantar fascial fibromatosis: Secondary | ICD-10-CM

## 2013-11-30 MED ORDER — GABAPENTIN 300 MG PO CAPS: 300.0000 mg | ORAL_CAPSULE | Freq: Three times a day (TID) | ORAL | Status: DC

## 2013-11-30 NOTE — Progress Notes (Signed)
Subjective:     Patient ID: Alicia Hogan, female   DOB: 1950-09-04, 63 y.o.   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 Scanned for second pair of orthotics for usage

## 2013-12-28 ENCOUNTER — Ambulatory Visit (INDEPENDENT_AMBULATORY_CARE_PROVIDER_SITE_OTHER): Payer: 59 | Admitting: Emergency Medicine

## 2013-12-28 ENCOUNTER — Ambulatory Visit (INDEPENDENT_AMBULATORY_CARE_PROVIDER_SITE_OTHER): Payer: 59

## 2013-12-28 VITALS — BP 152/96 | HR 108 | Temp 97.7°F | Resp 16 | Ht 69.5 in | Wt 226.0 lb

## 2013-12-28 DIAGNOSIS — R030 Elevated blood-pressure reading, without diagnosis of hypertension: Secondary | ICD-10-CM

## 2013-12-28 DIAGNOSIS — R0781 Pleurodynia: Secondary | ICD-10-CM

## 2013-12-28 DIAGNOSIS — M722 Plantar fascial fibromatosis: Secondary | ICD-10-CM

## 2013-12-28 DIAGNOSIS — IMO0001 Reserved for inherently not codable concepts without codable children: Secondary | ICD-10-CM

## 2013-12-28 HISTORY — DX: Plantar fascial fibromatosis: M72.2

## 2013-12-28 MED ORDER — HYDROCODONE-ACETAMINOPHEN 5-325 MG PO TABS: 1.0000 | ORAL_TABLET | Freq: Four times a day (QID) | ORAL | Status: DC | PRN

## 2013-12-28 NOTE — Patient Instructions (Signed)
There was no fracture or lung abnormality on the xray.  Please take the norco up to every 6 hours as needed for the pain. Please be sure to try to take some deep breaths and do some coughing every day over these next few days to ensure your lungs stay strong and healthy while your ribs are recovering.  Please return to clinic or ER ASAP if you begin to have shortness of breath or trouble breathing.

## 2013-12-28 NOTE — Progress Notes (Signed)
Subjective:    Patient ID: Alicia Hogan, female    DOB: 1950-02-27, 63 y.o.   MRN: [id number]  Alicia Bellows, MD  Chief Complaint  Patient presents with  . Rib Injury    from fall   Patient Active Problem List   Diagnosis Date Noted  . Hypertension   . Plantar fasciitis, bilateral 08/03/2012  . Equinus deformity of foot, acquired 08/03/2012  . Metatarsal deformity 08/03/2012   Prior to Admission medications   Medication Sig Start Date End Date Taking? Authorizing Provider  gabapentin (NEURONTIN) 300 MG capsule Take 1 capsule (300 mg total) by mouth 3 (three) times daily. 11/30/13  Yes Tamala Fothergill Regal, DPM  losartan (COZAAR) 50 MG tablet Take 50 mg by mouth daily.   Yes Historical Provider, MD  lovastatin (MEVACOR) 20 MG tablet Take 20 mg by mouth at bedtime.   Yes Historical Provider, MD   Medications, allergies, past medical history, surgical history, social history and problem list reviewed and updated.  HPI  63 yof with PMH HTN presents today after falling on left side while walking dog.   She was walking on concrete and had her 100# golden retriever on a leash. Her dog darted quickly and caused her to stumble and fall. She fell on the concrete onto her left arm and left side. She did not hit her head or have any LOC. She felt pain around her left side under her left breast as well as on the back of her left arm above her elbow after the fall.   She denies any SOB, wheezing, hemoptysis, presyncope, syncope, or abd pain since the fall.   BP is 152/96 in clinic today, HR 108. She states that she is in a lot of pain from the fall. She is on losartan and taking daily. She checks her BP at home and it runs in the 120-130s/70-80s. She is an Therapist, sports.   Review of Systems No fever, chills. See HPI.     Objective:   Physical Exam  Constitutional: She appears well-developed and well-nourished.  Non-toxic appearance. She does not have a sickly appearance. She does not appear ill. No  distress.  BP 152/96 mmHg  Pulse 108  Temp(Src) 97.7 F (36.5 C) (Oral)  Resp 16  Ht 5' 9.5" (1.765 m)  Wt 226 lb (102.513 kg)  BMI 32.91 kg/m2  SpO2 97%   Eyes: Pupils are equal, round, and reactive to light.  Neck: Trachea normal. No tracheal deviation present.  Cardiovascular: Normal rate, regular rhythm, S1 normal, S2 normal and normal heart sounds.  Exam reveals no gallop.   No murmur heard. Pulmonary/Chest: Effort normal and breath sounds normal. No accessory muscle usage. No respiratory distress. She has no decreased breath sounds. She has no wheezes. She has no rhonchi. She has no rales.  Musculoskeletal:       Left shoulder: Normal. She exhibits normal range of motion, no tenderness, no bony tenderness and no pain.       Left elbow: Normal. She exhibits normal range of motion, no swelling and no effusion. No tenderness found.       Left wrist: Normal. She exhibits normal range of motion, no tenderness and no bony tenderness.       Left upper arm: She exhibits tenderness. She exhibits no swelling, no edema, no deformity and no laceration.       Left hand: Normal. She exhibits normal range of motion, no tenderness and no bony tenderness. Normal sensation noted.  Normal strength noted.  TTP over posterior aspect left arm, superior to elbow joint. No abrasion over area. No bruising over area. No swelling over area.    [facility] reading (PRIMARY) by  Dr. Ouida Sills. Findings: Lungs markings equal bilaterally. No evidence of pneumothorax, hemothorax, pulmonary contusion. No bony abnormality.      Assessment & Plan:   63 yof with PMH HTN presents today after falling on left side while walking dog.   Rib pain on left side - Plan: DG Ribs Unilateral W/Chest Left, HYDROcodone-acetaminophen (NORCO) 5-325 MG per tablet --No fx or lung abnormality on xray --norco for pain --encouraged deep breathing/coughing to prevent atelectasis  Elevated blood pressure --Pt states she checks at home and  runs regular --F/U with PCP  Julieta Gutting, PA-C Physician Assistant-Certified [facility]  12/28/2013 2:48 PM

## 2013-12-29 ENCOUNTER — Telehealth: Payer: Self-pay | Admitting: *Deleted

## 2013-12-29 NOTE — Telephone Encounter (Signed)
Patient called checking on her 2nd pair of orthotics. Its been 3-4 weeks and haven't heard yet.

## 2013-12-29 NOTE — Telephone Encounter (Signed)
Returned call-let patient know that they were not here yet. Will call once they arrive. Did check with the lab and they said it may be another week or so until they are done.

## 2014-01-13 ENCOUNTER — Telehealth: Payer: Self-pay

## 2014-01-13 NOTE — Telephone Encounter (Signed)
Left message regarding that orthotics have arrived and are available for pick up

## 2014-01-17 ENCOUNTER — Encounter: Payer: Self-pay | Admitting: Podiatry

## 2014-03-29 ENCOUNTER — Ambulatory Visit (INDEPENDENT_AMBULATORY_CARE_PROVIDER_SITE_OTHER): Payer: 59 | Admitting: Podiatry

## 2014-03-29 ENCOUNTER — Encounter: Payer: Self-pay | Admitting: Podiatry

## 2014-03-29 VITALS — BP 142/76 | HR 75 | Resp 16

## 2014-03-29 DIAGNOSIS — M722 Plantar fascial fibromatosis: Secondary | ICD-10-CM

## 2014-03-29 DIAGNOSIS — G629 Polyneuropathy, unspecified: Secondary | ICD-10-CM

## 2014-03-29 DIAGNOSIS — M21969 Unspecified acquired deformity of unspecified lower leg: Secondary | ICD-10-CM

## 2014-03-29 MED ORDER — GABAPENTIN 300 MG PO CAPS: 300.0000 mg | ORAL_CAPSULE | Freq: Three times a day (TID) | ORAL | Status: DC

## 2014-03-29 NOTE — Progress Notes (Signed)
Subjective:     Patient ID: Alicia Hogan, female   DOB: 08-May-1950, 64 y.o.   MRN: [id number]  HPI patient states at this time she's doing well and she's continuing to take gabapentin 3 per day and states the burning has for the most part resolved with some discomfort in the forefoot right over left   Review of Systems     Objective:   Physical Exam Neurovascular status intact muscle strength adequate with continued light discomfort in the lesser MPJs right over left foot and is also noted to have minimal plantar pain in the heel region both feet    Assessment:     Doing well on Neurontin with mild distal inflammation right or possible nerve entrapment    Plan:     Reviewed both conditions and we are getting to try to gradually reduce her Neurontin usage over the next 6-8 weeks. If symptoms were to increase I want to see her back or if she should develop chronic inflammation want to see her back. At this time I'm very hopeful for the long-term for this patient

## 2014-04-18 ENCOUNTER — Other Ambulatory Visit: Payer: Self-pay | Admitting: Podiatry

## 2014-06-13 ENCOUNTER — Ambulatory Visit (INDEPENDENT_AMBULATORY_CARE_PROVIDER_SITE_OTHER): Payer: 59 | Admitting: Cardiology

## 2014-06-13 ENCOUNTER — Encounter: Payer: Self-pay | Admitting: Cardiology

## 2014-06-13 VITALS — BP 137/82 | HR 92 | Ht 68.5 in | Wt 228.0 lb

## 2014-06-13 DIAGNOSIS — I10 Essential (primary) hypertension: Secondary | ICD-10-CM

## 2014-06-13 DIAGNOSIS — E785 Hyperlipidemia, unspecified: Secondary | ICD-10-CM | POA: Diagnosis not present

## 2014-06-13 DIAGNOSIS — I493 Ventricular premature depolarization: Secondary | ICD-10-CM | POA: Diagnosis not present

## 2014-06-13 DIAGNOSIS — R9431 Abnormal electrocardiogram [ECG] [EKG]: Secondary | ICD-10-CM

## 2014-06-13 DIAGNOSIS — R0789 Other chest pain: Secondary | ICD-10-CM | POA: Diagnosis not present

## 2014-06-13 DIAGNOSIS — I447 Left bundle-branch block, unspecified: Secondary | ICD-10-CM | POA: Diagnosis not present

## 2014-06-13 LAB — CBC
HCT: 44.8 % (ref 36.0–46.0)
Hemoglobin: 15.3 g/dL — ABNORMAL HIGH (ref 12.0–15.0)
MCHC: 34.2 g/dL (ref 30.0–36.0)
MCV: 90.9 fl (ref 78.0–100.0)
Platelets: 297 10*3/uL (ref 150.0–400.0)
RBC: 4.93 Mil/uL (ref 3.87–5.11)
RDW: 13 % (ref 11.5–15.5)
WBC: 8.3 10*3/uL (ref 4.0–10.5)

## 2014-06-13 LAB — COMPREHENSIVE METABOLIC PANEL
ALT: 36 U/L — ABNORMAL HIGH (ref 0–35)
AST: 18 U/L (ref 0–37)
Albumin: 4.3 g/dL (ref 3.5–5.2)
Alkaline Phosphatase: 75 U/L (ref 39–117)
BUN: 15 mg/dL (ref 6–23)
CO2: 27 mEq/L (ref 19–32)
Calcium: 9.9 mg/dL (ref 8.4–10.5)
Chloride: 105 mEq/L (ref 96–112)
Creatinine, Ser: 0.98 mg/dL (ref 0.40–1.20)
GFR: 61.16 mL/min (ref >60.00)
Glucose, Bld: 91 mg/dL (ref 70–99)
Potassium: 4.2 mEq/L (ref 3.5–5.1)
Sodium: 138 mEq/L (ref 135–145)
Total Bilirubin: 0.4 mg/dL (ref 0.2–1.2)
Total Protein: 7.6 g/dL (ref 6.0–8.3)

## 2014-06-13 LAB — TSH: TSH: 0.97 u[IU]/mL (ref 0.35–4.50)

## 2014-06-13 MED ORDER — METOPROLOL TARTRATE 25 MG PO TABS: 12.5000 mg | ORAL_TABLET | Freq: Two times a day (BID) | ORAL | Status: DC

## 2014-06-13 NOTE — Assessment & Plan Note (Signed)
New c/w 2013

## 2014-06-13 NOTE — Patient Instructions (Signed)
Medication Instructions:  START TAKING METOPROLOL 12.5 MG TWICE A DAY   START TAKING ASPIRIN 81 MG ONCE A DAY   Labwork: TSH CMET AND CBC   Testing/Procedures: Your physician has requested that you have a lexiscan myoview. AS SOON AS POSSIBLE  For further information please visit [email or web address]. Please follow instruction sheet, as given.  Your physician has requested that you have an echocardiogram. Echocardiography is a painless test that uses sound waves to create images of your heart. It provides your doctor with information about the size and shape of your heart and how well your heart's chambers and valves are working. This procedure takes approximately one hour. There are no restrictions for this procedure.    Follow-Up:  WITH DR Mare Ferrari IN 3 MONTHS   Any Other Special Instructions Will Be Listed Below (If Applicable).

## 2014-06-13 NOTE — Assessment & Plan Note (Signed)
Symptomatic

## 2014-06-13 NOTE — Assessment & Plan Note (Signed)
On Meavacor

## 2014-06-13 NOTE — Assessment & Plan Note (Signed)
New EKG changes, r/o cardiac

## 2014-06-13 NOTE — Assessment & Plan Note (Signed)
On ARB 

## 2014-06-13 NOTE — Progress Notes (Signed)
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 She has no history of CAD or prior cardiac work up. She is a prior smoker and has a history of treated HTN and dyslipidemia. She is seen in the [facility] today for complaints of vague chest discomfort and an abnormal EKG. The pt reports she has had a sense of "pressure" and flushed sensation at work last week during stressful shift. She took her pulse and it was irregular. She had similar symptoms this am at breakfast. She went to her PCP and was noted to have multifocal PVCs and a new LBBB. She denies any arm pain, jaw pain, unusual dyspnea, or diaphoresis.    Current Outpatient Prescriptions  Medication Sig Dispense Refill  . gabapentin (NEURONTIN) 300 MG capsule Take 1 capsule (300 mg total) by mouth 3 (three) times daily. (Patient taking differently: Take 300 mg by mouth 3 (three) times daily. Take one tab BY MOUTH  at 4pm  AND TWO TABS AT BED-TIME) 90 capsule 3  . losartan (COZAAR) 50 MG tablet Take 50 mg by mouth daily.    Marland Kitchen lovastatin (MEVACOR) 20 MG tablet Take 20 mg by mouth at bedtime.     Current Facility-Administered Medications  Medication Dose Route Frequency Provider Last Rate Last Dose  . Arch Bandage MISC 2 each  2 each Does not apply Once Myeong Roxine Caddy, DPM        Allergies  Allergen Reactions  . Codeine     History   Social History  . Marital Status: Married    Spouse Name: N/A  . Number of Children: N/A  . Years of Education: N/A   Occupational History  . Not on file.   Social History Main Topics  . Smoking status: Former Smoker    Quit date: 04/12/2008  . Smokeless tobacco: Never Used  . Alcohol Use: No  . Drug Use: No  . Sexual Activity: Not on file   Other Topics Concern  . Not on file   Social History Narrative     Review of Systems: General: negative for chills,  fever, night sweats or weight changes.  Cardiovascular: negative for chest pain, dyspnea on exertion, edema, orthopnea, palpitations, paroxysmal nocturnal dyspnea or shortness of breath Dermatological: negative for rash Respiratory: negative for cough or wheezing Urologic: negative for hematuria Abdominal: negative for nausea, vomiting, diarrhea, bright red blood per rectum, melena, or hematemesis Neurologic: negative for visual changes, syncope, or dizziness All other systems reviewed and are otherwise negative except as noted above.    Blood pressure 137/82, pulse 92, height 5' 8.5" (1.74 m), weight 228 lb (103.42 kg).  General appearance: alert, cooperative, no distress and mildly obese Neck: no carotid bruit and no JVD Lungs: clear to auscultation bilaterally Heart: regular rate and rhythm Abdomen: soft, non-tender; bowel sounds normal; no masses,  no organomegaly Extremities: extremities normal, atraumatic, no cyanosis or edema Pulses: 2+ and symmetric Skin: Skin color, texture, turgor normal. No rashes or lesions Neurologic: Grossly normal  EKG NSR, LBBB, multifocal PVCs  ASSESSMENT AND PLAN:   Chest discomfort New EKG changes, r/o cardiac   LBBB (left bundle branch block) New c/w 2013   Multifocal PVCs Symptomatic   Hypertension On ARB   Dyslipidemia On Meavacor    PLAN  Pt was seen by Dr Mare Ferrari and myself in the office.  We will arrange for her to have a Lexiscan Myoview and echo. We added Lopressor 12.5 mg BID and ASA 81mg . We'll get baseline labs and TSH as well.   Tilley Faeth KPA-C 06/13/2014 3:18 PM

## 2014-06-21 ENCOUNTER — Telehealth (HOSPITAL_COMMUNITY): Payer: Self-pay | Admitting: *Deleted

## 2014-06-21 NOTE — Telephone Encounter (Signed)
Left message on voicemail in reference to upcoming appointment scheduled on 06/23/14 at 7:15 am* with detailed instructions given per Myocardial Perfusion Study Information Sheet for the test. Phone number given for call back for any questions. Crissie Figures, RN

## 2014-06-23 ENCOUNTER — Ambulatory Visit (HOSPITAL_COMMUNITY): Payer: 59 | Attending: Internal Medicine

## 2014-06-23 ENCOUNTER — Other Ambulatory Visit: Payer: Self-pay

## 2014-06-23 ENCOUNTER — Ambulatory Visit (HOSPITAL_BASED_OUTPATIENT_CLINIC_OR_DEPARTMENT_OTHER): Payer: 59

## 2014-06-23 DIAGNOSIS — R0789 Other chest pain: Secondary | ICD-10-CM | POA: Diagnosis present

## 2014-06-23 DIAGNOSIS — I10 Essential (primary) hypertension: Secondary | ICD-10-CM | POA: Diagnosis not present

## 2014-06-23 DIAGNOSIS — R9431 Abnormal electrocardiogram [ECG] [EKG]: Secondary | ICD-10-CM

## 2014-06-23 DIAGNOSIS — I447 Left bundle-branch block, unspecified: Secondary | ICD-10-CM | POA: Diagnosis not present

## 2014-06-23 LAB — MYOCARDIAL PERFUSION IMAGING
Estimated workload: 1 METS
LV dias vol: 81 mL
LV sys vol: 29 mL
Nuc Stress EF: 64 %
Peak HR: 71 {beats}/min
Percent of predicted max HR: 44 %
RATE: 0.32
Rest HR: 55 {beats}/min
SDS: 1
SRS: 3
SSS: 4
Stage 1 DBP: 80 mmHg
Stage 1 Grade: 0 %
Stage 1 HR: 62 {beats}/min
Stage 1 SBP: 131 mmHg
Stage 1 Speed: 0 mph
Stage 2 Grade: 0 %
Stage 2 HR: 62 {beats}/min
Stage 2 Speed: 0 mph
Stage 3 DBP: 82 mmHg
Stage 3 Grade: 0 %
Stage 3 HR: 68 {beats}/min
Stage 3 SBP: 130 mmHg
Stage 3 Speed: 0 mph
Stage 4 Grade: 0 %
Stage 4 HR: 71 {beats}/min
Stage 4 Speed: 0 mph
Stage 5 DBP: 79 mmHg
Stage 5 Grade: 0 %
Stage 5 HR: 68 {beats}/min
Stage 5 SBP: 117 mmHg
Stage 5 Speed: 0 mph
Stage 6 DBP: 76 mmHg
Stage 6 Grade: 0 %
Stage 6 HR: 60 {beats}/min
Stage 6 SBP: 122 mmHg
Stage 6 Speed: 0 mph
TID: 1.01

## 2014-06-23 MED ORDER — TECHNETIUM TC 99M SESTAMIBI GENERIC - CARDIOLITE
33.0000 | Freq: Once | INTRAVENOUS | Status: AC | PRN
  Administered 2014-06-23: 33 via INTRAVENOUS

## 2014-06-23 MED ORDER — REGADENOSON 0.4 MG/5ML IV SOLN
0.4000 mg | Freq: Once | INTRAVENOUS | Status: AC
  Administered 2014-06-23: 0.4 mg via INTRAVENOUS

## 2014-06-23 MED ORDER — TECHNETIUM TC 99M SESTAMIBI GENERIC - CARDIOLITE
11.0000 | Freq: Once | INTRAVENOUS | Status: AC | PRN
  Administered 2014-06-23: 11 via INTRAVENOUS

## 2014-06-26 ENCOUNTER — Telehealth: Payer: Self-pay | Admitting: *Deleted

## 2014-06-26 NOTE — Telephone Encounter (Signed)
Follow up      Returning Carol's call.  Please call after 3:30

## 2014-06-26 NOTE — Telephone Encounter (Signed)
lmptcb to go over both myoview and echo results per Melina Copa, PA.

## 2014-06-26 NOTE — Telephone Encounter (Signed)
Pt notified of echo and myoview results by phone with verbal understanding.

## 2014-08-22 ENCOUNTER — Other Ambulatory Visit (HOSPITAL_COMMUNITY): Payer: Self-pay | Admitting: Family Medicine

## 2014-08-22 DIAGNOSIS — Z1231 Encounter for screening mammogram for malignant neoplasm of breast: Secondary | ICD-10-CM

## 2014-09-12 ENCOUNTER — Ambulatory Visit (HOSPITAL_COMMUNITY)
Admission: RE | Admit: 2014-09-12 | Discharge: 2014-09-12 | Disposition: A | Discharge status: Home / Self Care | Payer: 59 | Source: Ambulatory Visit | Attending: Family Medicine | Admitting: Family Medicine

## 2014-09-12 DIAGNOSIS — Z1231 Encounter for screening mammogram for malignant neoplasm of breast: Secondary | ICD-10-CM | POA: Diagnosis present

## 2014-09-13 ENCOUNTER — Ambulatory Visit (INDEPENDENT_AMBULATORY_CARE_PROVIDER_SITE_OTHER): Payer: 59 | Admitting: Podiatry

## 2014-09-13 ENCOUNTER — Encounter: Payer: Self-pay | Admitting: Podiatry

## 2014-09-13 VITALS — BP 126/70 | HR 67 | Resp 15

## 2014-09-13 DIAGNOSIS — D3610 Benign neoplasm of peripheral nerves and autonomic nervous system, unspecified: Secondary | ICD-10-CM

## 2014-09-13 DIAGNOSIS — M779 Enthesopathy, unspecified: Secondary | ICD-10-CM

## 2014-09-13 DIAGNOSIS — G629 Polyneuropathy, unspecified: Secondary | ICD-10-CM

## 2014-09-13 NOTE — Progress Notes (Signed)
Subjective:     Patient ID: Alicia Hogan, female   DOB: Jul 02, 1950, 64 y.o.   MRN: [id number]  HPI patient states she is still having pain in the forefoot of both feet with shooting discomforts and a feeling of tightness. The Neurontin is helping her and the orthotics are helping her and her heels are doing well but her forefoot bilateral does hurt   Review of Systems     Objective:   Physical Exam Neurovascular status found to be intact with discomfort that's mostly centered in the third interspace bilateral with mild shooting pain and also discomfort across the metatarsal phalangeal joints    Assessment:     Possible neuroma versus inflammatory capsulitis versus neuropathy    Plan:     Martin Majestic ahead and treated as neuroma today with a purified alcohol solution Marcaine mixture and then discussed the utilization of steroidal pack if symptoms persist. Continue her Neurontin as prescribed

## 2014-09-27 ENCOUNTER — Ambulatory Visit (INDEPENDENT_AMBULATORY_CARE_PROVIDER_SITE_OTHER): Payer: 59 | Admitting: Podiatry

## 2014-09-27 ENCOUNTER — Encounter: Payer: Self-pay | Admitting: Podiatry

## 2014-09-27 VITALS — BP 134/70 | HR 67 | Resp 16

## 2014-09-27 DIAGNOSIS — D3610 Benign neoplasm of peripheral nerves and autonomic nervous system, unspecified: Secondary | ICD-10-CM

## 2014-09-27 DIAGNOSIS — G629 Polyneuropathy, unspecified: Secondary | ICD-10-CM

## 2014-09-27 DIAGNOSIS — M779 Enthesopathy, unspecified: Secondary | ICD-10-CM

## 2014-09-27 NOTE — Progress Notes (Signed)
Subjective:     Patient ID: Alicia Hogan, female   DOB: 08/04/1950, 64 y.o.   MRN: [id number]  HPI patient presents stating that the injections did not seem to make a big difference for my feet   Review of Systems     Objective:   Physical Exam  neurovascular status intact with continued significant improvement in the plantar fascia of both feet but forefoot irritation of a nondescript nature    Assessment:      probable neuropathy versus capsulitis or metatarsalgia of the forefoot bilateral    Plan:      continue with same treatment protocol as previous and at this time unfortunately cannot tolerate the Neurontin well so we will continue to use soaks and supportive shoe gear

## 2014-10-04 ENCOUNTER — Ambulatory Visit (INDEPENDENT_AMBULATORY_CARE_PROVIDER_SITE_OTHER): Payer: 59 | Admitting: Cardiology

## 2014-10-04 ENCOUNTER — Encounter: Payer: Self-pay | Admitting: Cardiology

## 2014-10-04 VITALS — BP 110/60 | HR 60 | Ht 68.5 in | Wt 228.0 lb

## 2014-10-04 DIAGNOSIS — E785 Hyperlipidemia, unspecified: Secondary | ICD-10-CM

## 2014-10-04 DIAGNOSIS — I447 Left bundle-branch block, unspecified: Secondary | ICD-10-CM | POA: Diagnosis not present

## 2014-10-04 DIAGNOSIS — I10 Essential (primary) hypertension: Secondary | ICD-10-CM

## 2014-10-04 MED ORDER — METOPROLOL SUCCINATE ER 25 MG PO TB24
25.0000 mg | ORAL_TABLET | Freq: Every day | ORAL | Status: DC
Start: 2014-10-04 — End: 2015-10-29

## 2014-10-04 NOTE — Progress Notes (Signed)
Cardiology Office Note   Date:  10/04/2014   ID:  Alicia, Hogan 1950/12/30, MRN [id number]  PCP:  [name], MD  Cardiologist: Darlin Coco MD  Chief Complaint  Patient presents with  . HYPERTENSION      History of Present Illness: Alicia Hogan is a 64 y.o. female who presents for cardiology follow-up.  She was seen by [facility] and by myself on 06/13/14 for evaluation of PVCs and a new left bundle branch block.  She underwent Lexiscan Myoview stress test on 06/23/14.  The ejection fraction was normal at 64% and there were no perfusion deficits.  The patient also underwent an echocardiogram on 06/23/14.  The echo showed mild LVH and was otherwise normal with ejection fraction of 55-60%.  She was placed on Lopressor 12.5 mg twice a day for control of her PVCs.  She reports that her PVCs have resolved.  She has not been having any exertional chest pain.  She does have some mild exertional dyspnea when she walks up a steep hill.  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  She tries to walk up the stairs at the hospital for exercise.  She also walks in her neighborhood with her husband and they also enjoy dancing for exercise    Past Medical History  Diagnosis Date  . Hypertension   . Plantar fasciitis 12/28/2013    Past Surgical History  Procedure Laterality Date  . Meniscus repair Left 2012     Current Outpatient Prescriptions  Medication Sig Dispense Refill  . gabapentin (NEURONTIN) 300 MG capsule Take 300 mg by mouth 2 (two) times daily.    Marland Kitchen losartan (COZAAR) 50 MG tablet Take 50 mg by mouth daily.    Marland Kitchen lovastatin (MEVACOR) 20 MG tablet Take 20 mg by mouth at bedtime.    . metoprolol succinate (TOPROL XL) 25 MG 24 hr tablet Take 1 tablet (25 mg total) by mouth daily. 90 tablet 3   Current Facility-Administered Medications  Medication Dose Route Frequency Provider Last Rate Last Dose  . Arch Bandage MISC 2 each  2 each Does not  apply Once Myeong Roxine Caddy, DPM        Allergies:   Codeine    Social History:  The patient  reports that she quit smoking about 6 years ago. She has never used smokeless tobacco. She reports that she does not drink alcohol or use illicit drugs.   Family History:  The patient's  family history includes Diabetes in her maternal grandmother; Hypertension in her father and mother; Stroke in her maternal grandmother. There is no history of Heart attack.    ROS:  Please see the history of present illness.   Otherwise, review of systems are positive for none.   All other systems are reviewed and negative.    PHYSICAL EXAM: VS:  BP 110/60 mmHg  Pulse 60  Ht 5' 8.5" (1.74 m)  Wt 228 lb (103.42 kg)  BMI 34.16 kg/m2 , BMI Body mass index is 34.16 kg/(m^2). GEN: Well nourished, well developed, in no acute distress HEENT: normal Neck: no JVD, carotid bruits, or masses Cardiac: RRR; no murmurs, rubs, or gallops,no edema  Respiratory:  clear to auscultation bilaterally, normal work of breathing GI: soft, nontender, nondistended, + BS MS: no deformity or atrophy Skin: warm and dry, no rash Neuro:  Strength and sensation are intact Psych: euthymic mood, full affect   EKG:  EKG is ordered today. The ekg ordered today demonstrates normal sinus rhythm at 60 bpm.  Left bundle branch block is unchanged.  Since the previous tracing her PVCs have disappeared.   Recent Labs: 06/13/2014: ALT 36*; BUN 15; Creatinine, Ser 0.98; Hemoglobin 15.3*; Platelets 297.0; Potassium 4.2; Sodium 138; TSH 0.97    Lipid Panel No results found for: CHOL, TRIG, HDL, CHOLHDL, VLDL, LDLCALC, LDLDIRECT    Wt Readings from Last 3 Encounters:  10/04/14 228 lb (103.42 kg)  06/23/14 228 lb (103.42 kg)  06/13/14 228 lb (103.42 kg)         ASSESSMENT AND PLAN:  1.  Left bundle branch block, currently asymptomatic.  Myoview showed no evidence of ischemia.  She has normal left ventricular systolic function. 2.   Essential hypertension controlled on current medications 3.  Plantar fasciitis.  Disposition: At her request we will switch her to the long-acting metoprolol succinate 25 mg once a day.  She has trouble remembering the twice a day metoprolol tartrate. Recheck in one year for office visit and EKG, or sooner if needed.   Current medicines are reviewed at length with the patient today.  The patient does not have concerns regarding medicines.  The following changes have been made:  no change  Labs/ tests ordered today include:   Orders Placed This Encounter  Procedures  . EKG 12-Lead       Signed, Darlin Coco MD 10/04/2014 9:28 AM    [facility] [place], [place], [place]  [address] Phone: [phone]; Fax: [phone]

## 2014-10-04 NOTE — Patient Instructions (Signed)
Medication Instructions:  STOP LOPRESSOR AND START TOPROL XL (METOPROLOL SUCC) 25 MG ONCE A DAY  Labwork: NONE  Testing/Procedures: NONE  Follow-Up: Your physician wants you to follow-up in: 1 [place] will receive a reminder letter in the mail two months in advance. If you don't receive a letter, please call our office to schedule the follow-up appointment.

## 2014-11-02 ENCOUNTER — Other Ambulatory Visit (HOSPITAL_COMMUNITY): Payer: Self-pay | Admitting: Family Medicine

## 2014-11-02 DIAGNOSIS — Z87891 Personal history of nicotine dependence: Secondary | ICD-10-CM

## 2014-11-15 ENCOUNTER — Ambulatory Visit (HOSPITAL_COMMUNITY)
Admission: RE | Admit: 2014-11-15 | Discharge: 2014-11-15 | Disposition: A | Discharge status: Home / Self Care | Payer: 59 | Source: Ambulatory Visit | Attending: Family Medicine | Admitting: Family Medicine

## 2014-11-15 DIAGNOSIS — Z122 Encounter for screening for malignant neoplasm of respiratory organs: Secondary | ICD-10-CM | POA: Insufficient documentation

## 2014-11-15 DIAGNOSIS — Z87891 Personal history of nicotine dependence: Secondary | ICD-10-CM | POA: Insufficient documentation

## 2014-11-15 DIAGNOSIS — R918 Other nonspecific abnormal finding of lung field: Secondary | ICD-10-CM | POA: Insufficient documentation

## 2014-11-15 DIAGNOSIS — I2510 Atherosclerotic heart disease of native coronary artery without angina pectoris: Secondary | ICD-10-CM | POA: Insufficient documentation

## 2014-11-15 DIAGNOSIS — J432 Centrilobular emphysema: Secondary | ICD-10-CM | POA: Diagnosis not present

## 2015-02-20 MED FILL — LOSARTAN POTASSIUM 50 MG TA: 50 | 90 days supply | Qty: 90 | Fill #1

## 2015-02-20 MED FILL — LOVASTATIN 40 MG TABLET: 40 | 90 days supply | Qty: 45 | Fill #1

## 2015-03-12 ENCOUNTER — Other Ambulatory Visit: Payer: Self-pay | Admitting: Podiatry

## 2015-03-12 DIAGNOSIS — Z0100 Encounter for examination of eyes and vision without abnormal findings: Secondary | ICD-10-CM | POA: Diagnosis not present

## 2015-03-14 MED FILL — GABAPENTIN 300 MG CAPSULE: 300 | 90 days supply | Qty: 180 | Fill #0

## 2015-03-26 ENCOUNTER — Ambulatory Visit: Payer: 59 | Admitting: Podiatry

## 2015-05-01 MED FILL — METOPROLOL SUCC ER 25 MG TA: 25 | 90 days supply | Qty: 90 | Fill #2

## 2015-05-21 MED FILL — LOSARTAN POTASSIUM 50 MG TA: 50 | 90 days supply | Qty: 90 | Fill #2

## 2015-05-21 MED FILL — LOVASTATIN 40 MG TABLET: 40 | 90 days supply | Qty: 45 | Fill #2

## 2015-05-30 DIAGNOSIS — M5441 Lumbago with sciatica, right side: Secondary | ICD-10-CM | POA: Diagnosis not present

## 2015-05-30 DIAGNOSIS — M25551 Pain in right hip: Secondary | ICD-10-CM | POA: Diagnosis not present

## 2015-05-30 MED FILL — predniSONE 5 MG TABS: 5 | 12 days supply | Qty: 48 | Fill #0

## 2015-06-04 ENCOUNTER — Encounter: Payer: Self-pay | Admitting: Physical Therapy

## 2015-06-04 ENCOUNTER — Ambulatory Visit: Payer: 59 | Attending: Orthopaedic Surgery | Admitting: Physical Therapy

## 2015-06-04 DIAGNOSIS — M545 Low back pain, unspecified: Secondary | ICD-10-CM

## 2015-06-04 NOTE — Therapy (Signed)
[facility] [address] [place], [place], [address] Phone: [phone]   Fax:  [phone]  Physical Therapy Evaluation  Patient Details  Name: Alicia Hogan MRN: [id number] Date of Birth: 1950-02-25 Referring Provider: Dr. Melrose Nakayama  Encounter Date: 06/04/2015      PT End of Session - 06/04/15 1148    Visit Number 1   Date for PT Re-Evaluation 07/16/15   PT Start Time 1100   PT Stop Time 1140   PT Time Calculation (min) 40 min   Activity Tolerance Patient tolerated treatment well   Behavior During Therapy [facility] for tasks assessed/performed      Past Medical History  Diagnosis Date  . Hypertension   . Plantar fasciitis 12/28/2013    Past Surgical History  Procedure Laterality Date  . Meniscus repair Left 2012    There were no vitals filed for this visit.       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  Patient felt pain in right hip in end of February.  In March after 12 hour shift felt a discomfort going down into buttocks. Pain became progressively worse. Started Predisone last week and has become better and no  pain for 2 days.    Patient Stated Goals improve activity and reduce pain.    Currently in Pain? 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 Melrose Nakayama   Onset Date/Surgical Date 04/10/15   Prior Therapy None   Precautions   Precautions None   Restrictions   Weight Bearing Restrictions No   Balance Screen   Has the patient fallen in the past 6 months No   Has the patient had a decrease in activity level because of a fear of falling?  No   Is the patient reluctant to leave their home because of a fear of falling?  No   Home Ecologist residence   Prior Function   Level of Independence Independent   Vocation Full time employment   Engineer, mining   Leisure ballroom; bike riding ; walking   Cognition   Overall Cognitive Status Within Functional Limits for tasks assessed   Observation/Other Assessments   Focus on Therapeutic Outcomes (FOTO)  39% limitation   Posture/Postural Control   Posture/Postural Control Postural limitations   Postural Limitations Decreased lumbar lordosis;Rounded Shoulders;Forward head  hip shifted to left   ROM / Strength   AROM / PROM / Strength AROM;Strength   AROM   Lumbar Extension decreased by 25%   Lumbar - Left Side Bend decreased by 25%   Palpation   SI assessment  right ilum rotated  anteriorly; sacrum rotated right   Palpation comment palpable tenderness located in right psoas,    Special Tests    Special Tests Hip Special Tests   Hip Special Tests  Trendelenberg Test   Trendelenburg Test   Findings Positive   Side Left   Comments  drops right hip                   [facility] Adult PT Treatment/Exercise - 06/04/15 0001    Manual Therapy   Manual Therapy Muscle Energy Technique;Joint mobilization   Joint Mobilization correct sacrum   Muscle Energy Technique corre t right ilium                PT Education - 06/04/15 1139    Education provided Yes   Education Details hamstring and piriformis stretch; lumbar extension   Person(s) Educated Patient   Methods Explanation;Demonstration;Verbal cues;Handout   Comprehension Returned demonstration;Verbalized understanding          PT Short Term Goals - 06/04/15 1138    PT SHORT TERM GOAL #1   Title indepedent with flexibility exercises   Time 3   Period Weeks   Status New   PT SHORT TERM  GOAL #2   Title ability to demonstrate correct body mechanics with home and work tasks   Time 3   Period Weeks   Status New           PT Long Term Goals - 06/04/15 1134    PT LONG TERM GOAL #1   Title Independent with HEP   Time 6   Period Weeks   Status New   PT LONG TERM GOAL #2   Title return to ballroom dancing due to pain decreased >/= 75%   Time 6   Period Weeks   Status New   PT LONG TERM GOAL #3   Title work with 3 days without pain due to improved mobility    Time 6   Period Weeks   Status New   PT LONG TERM GOAL #4   Title FOTO </= 27% limitation   Time 6   Period Weeks   Status New               Plan - 06/04/15 1140    Clinical Impression Statement Patient is a 65 year old female with diagnosis of lumbar pain.  Patient has been taking predisone that has helped her pain.  Patient reports her pain is intermittent at level 3/10 and worse with movement.  Lumbar extension and left sidebending decreased by 25%.  Sacrum rotated right and right ilium is rotated anteriorly.  Patient has not been able to ballroom dance and will have pain with some work activites.  Patient is of low complexity.  Patient wil lbenefit from physical therapy to reduce pain and improve mobility    Rehab Potential Excellent   Clinical Impairments Affecting Rehab Potential None   PT Frequency 2x / week   PT Duration 6 weeks   PT Treatment/Interventions Cryotherapy;Electrical Stimulation;Ultrasound;Traction;Moist Heat;Iontophoresis 4mg /ml Dexamethasone;Therapeutic activities;Therapeutic exercise;Patient/family education;Neuromuscular re-education;Manual techniques;Dry needling;Passive range of motion   PT Next Visit Plan correct pelvis; soft tissue work to right psoas and right SI area; joint mobilization to lumbar   PT Home Exercise Plan boody mechanics   Recommended Other Services None   Consulted and Agree with Plan of Care Patient      Patient will benefit from skilled therapeutic  intervention in order to improve the following deficits and impairments:  Pain, Decreased mobility, Decreased activity tolerance, Decreased endurance  Visit Diagnosis: Bilateral low back pain without sciatica - Plan: PT plan of care cert/re-cert     Problem List Patient Active Problem List   Diagnosis Date Noted  . Chest discomfort 06/13/2014  . LBBB (left bundle branch block)  06/13/2014  . 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 Dyslipidemia 06/13/2014  . Hypertension   . Plantar fasciitis, bilateral 08/03/2012  . Equinus deformity of foot, acquired 08/03/2012  . Metatarsal deformity 08/03/2012    Earlie Counts, PT 06/04/2015 11:48 AM   [facility] [address] [place], [place], [address] Phone: [phone]   Fax:  [phone]  Name: Alicia Hogan MRN: [id number] Date of Birth: 1950/07/12

## 2015-06-04 NOTE — Patient Instructions (Addendum)
Piriformis Stretch, Sitting    Sit, one ankle on opposite knee, same-side hand on crossed knee. Push down on knee, keeping spine straight. Lean torso forward, with flat back, until tension is felt in hamstrings and gluteals of crossed-leg side. Hold _30__ seconds.  Repeat _2__ times per session. Do _3__ sessions per day.  Copyright  VHI. All rights reserved.   Chair Sitting    Sit at edge of seat, spine straight, one leg extended, foot pointed to head. Put a hand on each thigh and bend forward from the hip, keeping spine straight. Allow hand on extended leg to reach toward toes. Support upper body with other arm. Hold _30__ seconds. Repeat _2__ times per session. Do 2___ sessions per day.  Copyright  VHI. All rights reserved.    Standing with upright posture, place hands on low back and extend/ lean back. Hold this position for 5 seconds and return to starting position. Repeat as directed.5 times throughout the day    [facility] [address], [place], [place] [address] Phone # [phone] Fax [phone]

## 2015-06-05 ENCOUNTER — Encounter: Payer: 59 | Admitting: Physical Therapy

## 2015-06-06 ENCOUNTER — Ambulatory Visit: Payer: 59 | Admitting: Physical Therapy

## 2015-06-06 ENCOUNTER — Encounter: Payer: Self-pay | Admitting: Physical Therapy

## 2015-06-06 DIAGNOSIS — M545 Low back pain, unspecified: Secondary | ICD-10-CM

## 2015-06-06 NOTE — Patient Instructions (Signed)
   Lifting Principles  Maintain proper posture and head alignment. Slide object as close as possible before lifting. Move obstacles out of the way. Test before lifting; ask for help if too heavy. Tighten stomach muscles without holding breath. Use smooth movements; do not jerk. Use legs to do the work, and pivot with feet. Distribute the work load symmetrically and close to the center of trunk. Push instead of pull whenever possible.   Squat down and hold basket close to stand. Use leg muscles to do the work.    Avoid twisting or bending back. Pivot around using foot movements, and bend at knees if needed when reaching for articles.        Getting Into / Out of Bed   Lower self to lie down on one side by raising legs and lowering head at the same time. Use arms to assist moving without twisting. Bend both knees to roll onto back if desired. To sit up, start from lying on side, and use same move-ments in reverse. Keep trunk aligned with legs.    Shift weight from front foot to back foot as item is lifted off shelf.    When leaning forward to pick object up from floor, extend one leg out behind. Keep back straight. Hold onto a sturdy support with other hand.      Sit upright, head facing forward. Try using a roll to support lower back. Keep shoulders relaxed, and avoid rounded back. Keep hips level with knees. Avoid crossing legs for long periods.     [facility] [address], [address] [place], [place] [address] Phone # [phone] Fax [phone]  

## 2015-06-06 NOTE — Therapy (Signed)
[facility] [address], [place], [place], [address] Phone: [phone]   Fax:  [phone]  Physical Therapy Treatment  Patient Details  Name: Alicia Hogan MRN: [id number] Date of Birth: June 07, 1950 Referring Provider: Dr. Melrose Nakayama  Encounter Date: 06/06/2015      PT End of Session - 06/06/15 1111    Visit Number 2   Date for PT Re-Evaluation 07/16/15   PT Start Time 1101   PT Stop Time 1143   PT Time Calculation (min) 42 min   Activity Tolerance Patient tolerated treatment well   Behavior During Therapy [facility] for tasks assessed/performed      Past Medical History  Diagnosis Date  . Hypertension   . Plantar fasciitis 12/28/2013    Past Surgical History  Procedure Laterality Date  . Meniscus repair Left 2012    There were no vitals filed for this visit.      Subjective Assessment - 06/06/15 1105    Subjective Pt is on Prednizone what helps a lot, she was able to walk with her dog for 1 mile this morning. 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     Patient Stated Goals improve activity and reduce pain.    Currently in Pain? No/denies   Pain Score 2    Pain Location Back   Pain Orientation Right;Lower   Pain Descriptors / Indicators Aching   Pain Type Acute pain   Pain Onset More than a month ago   Pain Frequency Intermittent   Aggravating Factors  bending, household chores, working 12 hours shifts   Pain Relieving Factors Prednizone   Multiple Pain Sites No                         [facility] Adult PT Treatment/Exercise - 06/06/15 0001    Bed Mobility   Bed Mobility --  educated on bodymechanics and practiced lifting   Exercises   Exercises Lumbar   Lumbar Exercises: Stretches   Active Hamstring Stretch 3  reps;20 seconds  in sitting   Piriformis Stretch 3 reps;20 seconds  in sitting and supine   Lumbar Exercises: Aerobic   Stationary Bike Nu-Step L 1 x 6 min   Manual Therapy   Manual Therapy Soft tissue mobilization   Soft tissue mobilization to Rt gluteal and piriformis area  with PROM for elongation                PT Education - 06/06/15 1123    Education provided Yes   Education Details bodymechanics and lifting   Person(s) Educated Patient   Methods Explanation   Comprehension Verbalized understanding;Returned demonstration          PT Short Term Goals - 06/04/15 1138    PT SHORT TERM GOAL #1   Title indepedent with flexibility exercises   Time 3   Period Weeks   Status New   PT SHORT TERM GOAL #2   Title ability to demonstrate correct body mechanics with home and work tasks   Time 3   Period Weeks   Status New           PT Long Term Goals - 06/04/15 1134    PT LONG TERM GOAL #1   Title Independent with HEP   Time 6  Period Weeks   Status New   PT LONG TERM GOAL #2   Title return to ballroom dancing due to pain decreased >/= 75%   Time 6   Period Weeks   Status New   PT LONG TERM GOAL #3   Title work with 3 days without pain due to improved mobility    Time 6   Period Weeks   Status New   PT LONG TERM GOAL #4   Title FOTO </= 27% limitation   Time 6   Period Weeks   Status New               Plan - 06/06/15 1112    Clinical Impression Statement Pt taking Prednizone and feels better. Piriformis stretch in sitting causes discomfort and is limited in ROM compared to left side. Pt will benefit from physical therapy to reduce pain and improve mobility and strength   Rehab Potential Excellent   Clinical Impairments Affecting Rehab Potential None   PT Frequency 2x / week   PT Duration 6 weeks   PT Treatment/Interventions Cryotherapy;Electrical Stimulation;Ultrasound;Traction;Moist Heat;Iontophoresis 4mg /ml Dexamethasone;Therapeutic  activities;Therapeutic exercise;Patient/family education;Neuromuscular re-education;Manual techniques;Dry needling;Passive range of motion   PT Next Visit Plan correct pelvis; soft tissue work to right psoas and right SI area; joint mobilization to lumbar   PT Home Exercise Plan boody mechanics   Consulted and Agree with Plan of Care Patient      Patient will benefit from skilled therapeutic intervention in order to improve the following deficits and impairments:  Pain, Decreased mobility, Decreased activity tolerance, Decreased endurance  Visit Diagnosis: Bilateral low back pain without sciatica     Problem List Patient Active Problem List   Diagnosis Date Noted  . Chest discomfort 06/13/2014  . LBBB (left bundle branch block) 06/13/2014  . 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 Dyslipidemia 06/13/2014  . Hypertension   . Plantar fasciitis, bilateral 08/03/2012  . Equinus deformity of foot, acquired 08/03/2012  . Metatarsal deformity 08/03/2012    NAUMANN-HOUEGNIFIO,Blessed Girdner PTA 06/06/2015, 11:49 AM  [facility] [address] [place], [place], [address] Phone: [phone]   Fax:  [phone]  Name: Alicia Hogan MRN: [id number] Date of Birth: 04-15-1950

## 2015-06-11 ENCOUNTER — Ambulatory Visit: Payer: 59 | Attending: Orthopaedic Surgery

## 2015-06-11 DIAGNOSIS — M545 Low back pain, unspecified: Secondary | ICD-10-CM

## 2015-06-11 MED FILL — GABAPENTIN 300 MG CAPSULE: 300 | 90 days supply | Qty: 180 | Fill #1

## 2015-06-11 NOTE — Therapy (Signed)
[facility] [address], [place], [place], [address] Phone: [phone]   Fax:  [phone]  Physical Therapy Treatment  Patient Details  Name: Alicia Hogan MRN: [id number] Date of Birth: May 05, 1950 Referring Provider: Dr. Melrose Nakayama  Encounter Date: 06/11/2015      PT End of Session - 06/11/15 1148    Visit Number 3   Date for PT Re-Evaluation 07/16/15   PT Start Time 1018   PT Stop Time 1059   PT Time Calculation (min) 41 min   Activity Tolerance Patient tolerated treatment well   Behavior During Therapy [facility] for tasks assessed/performed      Past Medical History  Diagnosis Date  . Hypertension   . Plantar fasciitis 12/28/2013    Past Surgical History  Procedure Laterality Date  . Meniscus repair Left 2012    There were no vitals filed for this visit.      Subjective Assessment - 06/11/15 1025    Subjective Pt reports that yesterday was the last day of Prednizone.  Still feeling good today without pain.     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     Currently in Pain? No/denies                         [facility] Adult PT Treatment/Exercise - 06/11/15 0001    Exercises   Exercises Lumbar;Knee/Hip   Lumbar Exercises: Stretches   Active Hamstring Stretch 3 reps;20 seconds  on steps   Single Knee to Chest Stretch 3 reps;20 seconds   Piriformis Stretch 3 reps;20 seconds  in sitting and supine (diagnonal knee to chest and figure 4)   Lumbar Exercises: Supine   Bridge 20 reps;5 seconds   Knee/Hip Exercises: Aerobic   Nustep Level 2 x 8 minutes  seat 9, arms 8                  PT Short Term Goals - 06/11/15 1026    PT SHORT TERM GOAL #1   Title indepedent with flexibility exercises   Status Achieved   PT SHORT TERM GOAL #2   Title  ability to demonstrate correct body mechanics with home and work tasks   Status Achieved           PT Long Term Goals - 06/11/15 1026    PT LONG TERM GOAL #2   Title return to ballroom dancing due to pain decreased >/= 75%   Time 6   Period Weeks   Status On-going   PT LONG TERM GOAL #3   Title work with 3 days without pain due to improved mobility    Time 6   Period Weeks   Status On-going               Plan - 06/11/15 1026    Clinical Impression Statement Pt without pain. Today is the first day without steroids.  Pt has not returned to dance class yet.  Pt denies any pain with work.  She will work again on Thursday.  Pt has received body mechanics education and is using modifications at home and work.  Pt will continue to benefit from skilled PT for flexiblity and core strength.     Rehab Potential Excellent   PT Frequency 2x / week   PT Duration 6  weeks   PT Treatment/Interventions Cryotherapy;Electrical Stimulation;Ultrasound;Traction;Moist Heat;Iontophoresis 4mg /ml Dexamethasone;Therapeutic activities;Therapeutic exercise;Patient/family education;Neuromuscular re-education;Manual techniques;Dry needling;Passive range of motion   PT Next Visit Plan flexibility, core strength, maual and modalities as needed.     Consulted and Agree with Plan of Care Patient      Patient will benefit from skilled therapeutic intervention in order to improve the following deficits and impairments:  Pain, Decreased mobility, Decreased activity tolerance, Decreased endurance  Visit Diagnosis: Bilateral low back pain without sciatica     Problem List Patient Active Problem List   Diagnosis Date Noted  . Chest discomfort 06/13/2014  . LBBB (left bundle branch block) 06/13/2014  . 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 Dyslipidemia 06/13/2014  . Hypertension   . Plantar fasciitis, bilateral 08/03/2012  . Equinus deformity of foot, acquired 08/03/2012  . Metatarsal deformity 08/03/2012      Sigurd Sos, PT 06/11/2015 11:49 AM  [facility] [address], [place], [place], [address] Phone: [phone]   Fax:  [phone]  Name: Alicia Hogan MRN: [id number] Date of Birth: 08-10-1950

## 2015-06-13 ENCOUNTER — Encounter: Payer: Self-pay | Admitting: Physical Therapy

## 2015-06-13 ENCOUNTER — Ambulatory Visit: Payer: 59 | Admitting: Physical Therapy

## 2015-06-13 DIAGNOSIS — M545 Low back pain, unspecified: Secondary | ICD-10-CM

## 2015-06-13 NOTE — Patient Instructions (Addendum)
External Rotation: Hip - Knees Apart (Side-Lying)    Lie on left side with hips and knees slightly bent, red t-band just above knees. Pull knees apart. Hold for  2-3 seconds. Rest for 2 seconds. Repeat  10 -20 times. Do 2-3 times a day.   Copyright  VHI. All rights reserved.  [facility] [address], [place], [place] [address] Phone # [phone] Fax [phone]

## 2015-06-13 NOTE — Therapy (Addendum)
[facility] [address], [place], [place], [address] Phone: [phone]   Fax:  [phone]  Physical Therapy Treatment  Patient Details  Name: Alicia Hogan MRN: [id number] Date of Birth: 01-Aug-1950 Referring Provider: Dr. Melrose Hogan  Encounter Date: 06/13/2015      PT End of Session - 06/13/15 1121    Visit Number 4   Date for PT Re-Evaluation 07/16/15   PT Start Time 1100   PT Stop Time 1145   PT Time Calculation (min) 45 min   Activity Tolerance Patient tolerated treatment well   Behavior During Therapy [facility] for tasks assessed/performed      Past Medical History  Diagnosis Date  . Hypertension   . Plantar fasciitis 12/28/2013    Past Surgical History  Procedure Laterality Date  . Meniscus repair Left 2012    There were no vitals filed for this visit.      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     Patient Stated Goals improve activity and reduce pain.    Currently in Pain? No/denies                         [facility] Adult PT Treatment/Exercise - 06/13/15 0001    Posture/Postural Control   Posture/Postural Control Postural limitations   Postural Limitations Decreased lumbar lordosis;Rounded Shoulders;Forward head   Exercises   Exercises Lumbar;Knee/Hip   Lumbar Exercises: Stretches   Active Hamstring Stretch 3 reps;20 seconds   Single Knee to Chest Stretch 3 reps;20 seconds   Piriformis Stretch 3 reps;20 seconds   Lumbar Exercises: Supine   Bridge 20 reps;5 seconds  with block b/w knees   Lumbar Exercises: Sidelying   Clam 20 reps;2 seconds  with red t-band   Knee/Hip Exercises: Aerobic    Nustep Level 2 x 9 minutes, 0.5 mph   Manual Therapy   Manual Therapy Soft tissue mobilization   Soft tissue mobilization to Rt gluteal and piriformis area  in Rt side in left sidelying                PT Education - 06/13/15 1144    Education provided Yes   Education Details clam with red t-band    Person(s) Educated Patient   Methods Explanation;Demonstration;Handout   Comprehension Verbalized understanding;Returned demonstration          PT Short Term Goals - 06/11/15 1026    PT SHORT TERM GOAL #1   Title indepedent with flexibility exercises   Status Achieved   PT SHORT TERM GOAL #2   Title ability to demonstrate correct body mechanics with home and work tasks   Status Achieved           PT Long Term Goals - 06/11/15 1026    PT LONG TERM GOAL #2   Title return to ballroom dancing due to pain decreased >/= 75%   Time 6   Period Weeks   Status On-going   PT LONG TERM GOAL #3   Title work with 3 days without pain due to improved mobility    Time 6   Period Weeks   Status On-going  Plan - 06/13/15 1122    Clinical Impression Statement Pt has no pain, she has good demo of exercises. Pt will go back to work on Thursday for 3x 12 hour shift without steroids. Pt will perform a few of her stretches a work   Rehab Potential Excellent   Clinical Impairments Affecting Rehab Potential None   PT Frequency 2x / week   PT Duration 6 weeks   PT Treatment/Interventions Cryotherapy;Electrical Stimulation;Ultrasound;Traction;Moist Heat;Iontophoresis 4mg/ml Dexamethasone;Therapeutic activities;Therapeutic exercise;Patient/family education;Neuromuscular re-education;Manual techniques;Hogan needling;Passive range of motion   PT Next Visit Plan flexibility, core strength, review clam add more strengthening if she tolerates well,  maual and modalities as needed.     PT Home Exercise Plan body mechanics   Consulted and Agree with Plan of Care Patient       Patient will benefit from skilled therapeutic intervention in order to improve the following deficits and impairments:  Pain, Decreased mobility, Decreased activity tolerance, Decreased endurance  Visit Diagnosis: Bilateral low back pain without sciatica     Problem List Patient Active Problem List   Diagnosis Date Noted  . Chest discomfort 06/13/2014  . LBBB (left bundle branch block) 06/13/2014  . 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 Dyslipidemia 06/13/2014  . Hypertension   . Plantar fasciitis, bilateral 08/03/2012  . Equinus deformity of foot, acquired 08/03/2012  . Metatarsal deformity 08/03/2012    NAUMANN-HOUEGNIFIO,Alicia Hogan 06/13/2015, 11:54 AM PHYSICAL THERAPY DISCHARGE SUMMARY  Visits from Start of Care: 4 Current functional level related to goals / functional outcomes: Pt called to cancel all remaining appts as she will do exercises at home.  Pt requested D/C at this time    Remaining deficits: See above for most current status.     Education / Equipment: HEP  Plan: Patient agrees to discharge.  Patient goals were partially met. Patient is being discharged due to being pleased with the current functional level.  ?????    Alicia Hogan, PT 06/25/2015 1:56 PM  [facility] [address], [address] [place], [place], [address] Phone: [phone]   Fax:  [phone]  Name: Alicia Hogan MRN: [id number] Date of Birth: 02/25/1950     

## 2015-06-18 ENCOUNTER — Ambulatory Visit: Payer: 59 | Admitting: Physical Therapy

## 2015-06-20 ENCOUNTER — Encounter: Payer: 59 | Admitting: Physical Therapy

## 2015-06-27 DIAGNOSIS — M5441 Lumbago with sciatica, right side: Secondary | ICD-10-CM | POA: Diagnosis not present

## 2015-06-27 DIAGNOSIS — M25551 Pain in right hip: Secondary | ICD-10-CM | POA: Diagnosis not present

## 2015-06-28 ENCOUNTER — Other Ambulatory Visit (HOSPITAL_COMMUNITY): Payer: Self-pay | Admitting: Orthopaedic Surgery

## 2015-06-28 DIAGNOSIS — M5442 Lumbago with sciatica, left side: Principal | ICD-10-CM

## 2015-06-28 DIAGNOSIS — M5441 Lumbago with sciatica, right side: Secondary | ICD-10-CM

## 2015-07-04 ENCOUNTER — Ambulatory Visit (HOSPITAL_COMMUNITY)
Admission: RE | Admit: 2015-07-04 | Discharge: 2015-07-04 | Disposition: A | Discharge status: Home / Self Care | Payer: 59 | Source: Ambulatory Visit | Attending: Orthopaedic Surgery | Admitting: Orthopaedic Surgery

## 2015-07-04 DIAGNOSIS — M47816 Spondylosis without myelopathy or radiculopathy, lumbar region: Secondary | ICD-10-CM | POA: Diagnosis not present

## 2015-07-04 DIAGNOSIS — M5441 Lumbago with sciatica, right side: Secondary | ICD-10-CM | POA: Diagnosis not present

## 2015-07-04 DIAGNOSIS — M5137 Other intervertebral disc degeneration, lumbosacral region: Secondary | ICD-10-CM | POA: Insufficient documentation

## 2015-07-04 DIAGNOSIS — M5127 Other intervertebral disc displacement, lumbosacral region: Secondary | ICD-10-CM | POA: Insufficient documentation

## 2015-07-04 DIAGNOSIS — M5136 Other intervertebral disc degeneration, lumbar region: Secondary | ICD-10-CM | POA: Diagnosis not present

## 2015-07-04 DIAGNOSIS — M5442 Lumbago with sciatica, left side: Secondary | ICD-10-CM

## 2015-07-11 DIAGNOSIS — M25551 Pain in right hip: Secondary | ICD-10-CM | POA: Diagnosis not present

## 2015-07-23 DIAGNOSIS — M5416 Radiculopathy, lumbar region: Secondary | ICD-10-CM | POA: Diagnosis not present

## 2015-07-25 DIAGNOSIS — M5416 Radiculopathy, lumbar region: Secondary | ICD-10-CM | POA: Diagnosis not present

## 2015-07-31 MED FILL — METOPROLOL SUCC ER 25 MG TA: 25 | 90 days supply | Qty: 90 | Fill #3

## 2015-08-06 MED FILL — predniSONE 5 MG TABS: 5 | 6 days supply | Qty: 21 | Fill #0

## 2015-08-20 ENCOUNTER — Other Ambulatory Visit: Payer: Self-pay | Admitting: Family Medicine

## 2015-08-20 DIAGNOSIS — Z1231 Encounter for screening mammogram for malignant neoplasm of breast: Secondary | ICD-10-CM

## 2015-08-20 DIAGNOSIS — M5416 Radiculopathy, lumbar region: Secondary | ICD-10-CM | POA: Diagnosis not present

## 2015-08-21 MED FILL — LOVASTATIN 40 MG TABLET: 40 | 90 days supply | Qty: 45 | Fill #3

## 2015-08-21 MED FILL — LOSARTAN POTASSIUM 50 MG TA: 50 | 90 days supply | Qty: 90 | Fill #0

## 2015-09-04 DIAGNOSIS — M5416 Radiculopathy, lumbar region: Secondary | ICD-10-CM | POA: Diagnosis not present

## 2015-09-07 MED FILL — GABAPENTIN 300 MG CAPSULE: 300 | 90 days supply | Qty: 180 | Fill #2

## 2015-09-17 ENCOUNTER — Ambulatory Visit
Admission: RE | Admit: 2015-09-17 | Discharge: 2015-09-17 | Disposition: A | Discharge status: Home / Self Care | Payer: 59 | Source: Ambulatory Visit | Attending: Family Medicine | Admitting: Family Medicine

## 2015-09-17 DIAGNOSIS — Z1231 Encounter for screening mammogram for malignant neoplasm of breast: Secondary | ICD-10-CM

## 2015-09-18 ENCOUNTER — Ambulatory Visit (INDEPENDENT_AMBULATORY_CARE_PROVIDER_SITE_OTHER): Payer: 59 | Admitting: Cardiovascular Disease

## 2015-09-18 ENCOUNTER — Encounter: Payer: Self-pay | Admitting: Cardiovascular Disease

## 2015-09-18 VITALS — BP 128/72 | HR 59 | Ht 68.0 in | Wt 228.8 lb

## 2015-09-18 DIAGNOSIS — I447 Left bundle-branch block, unspecified: Secondary | ICD-10-CM

## 2015-09-18 NOTE — Progress Notes (Signed)
Cardiology Office Note   Date:  09/18/2015   ID:  Alicia, Hogan 04/30/1950, MRN [id number]  PCP:  [name], MD  Cardiologist: Alicia Coco MD  No chief complaint on file.     History of Present Illness: Alicia Hogan is a 65 y.o.  female who presents for cardiology follow-up. New to me Previously seen by Dr Alicia Hogan on 06/13/14 for evaluation of PVCs and a new left bundle branch block.  She underwent Lexiscan Myoview stress test on 06/23/14.  The ejection fraction was normal at 64% and there were no perfusion deficits.  The patient also underwent an echocardiogram on 06/23/14.  The echo showed mild LVH and was otherwise normal with ejection fraction of 55-60%.  She was placed on Lopressor 12.5 mg twice a day for control of her PVCs.  She reports that her PVCs have resolved.  She has not been having any exertional chest pain.  She does have some mild exertional dyspnea when she walks up a steep hill.  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.  She tries to walk up the stairs at the hospital for exercise.  She also walks in her neighborhood with her husband and they also enjoy dancing for exercise   Some issues with plantar Fasciatus sees Dr Alicia Hogan  Thinks this relulted In neuropathy that she takes Neurontin for  Also L5/S1 disc dx and has had injections   Past Medical History:  Diagnosis Date  . Hypertension   . Plantar fasciitis 12/28/2013    Past Surgical History:  Procedure Laterality Date  . MENISCUS REPAIR Left 2012     Current Outpatient Prescriptions  Medication Sig Dispense Refill  . aspirin 81 MG tablet Take 81 mg by mouth daily.    Marland Kitchen gabapentin (NEURONTIN) 300 MG capsule Take 600 mg by mouth at bedtime. Reported on 06/04/2015    . losartan (COZAAR) 50 MG tablet Take 50 mg by mouth daily.    Marland Kitchen lovastatin (MEVACOR) 20 MG tablet Take 20 mg by mouth at bedtime.    . metoprolol succinate (TOPROL XL) 25 MG 24 hr tablet Take 1 tablet  (25 mg total) by mouth daily. 90 tablet 3  . Multiple Vitamins-Minerals (MULTIVITAMIN PO) Take 1 tablet by mouth daily.     Current Facility-Administered Medications  Medication Dose Route Frequency Provider Last Rate Last Dose  . Arch Bandage MISC 2 each  2 each Does not apply Once Alicia Hogan, DPM        Allergies:   Codeine    Social History:  The patient  reports that she quit smoking about 7 years ago. She has never used smokeless tobacco. She reports that she does not drink alcohol or use drugs.   Family History:  The patient's  family history includes Diabetes in her maternal grandmother; Hypertension in her father and mother; Stroke in her maternal grandmother.    ROS:  Please see the history of present illness.   Otherwise, review of systems are positive for none.   All other systems are reviewed and negative.    PHYSICAL EXAM: VS:  BP 128/72   Pulse (!) 59   Ht 5\' 8"  (1.727 m)   Wt 228 lb 12.8 oz (103.8 kg)   BMI 34.79 kg/m  , BMI Body mass index is 34.79 kg/m. GEN: Well nourished, well developed, in no acute distress  HEENT: normal  Neck: no JVD, carotid bruits, or  masses Cardiac: RRR; no murmurs, rubs, or gallops,no edema  Respiratory:  clear to auscultation bilaterally, normal work of breathing GI: soft, nontender, nondistended, + BS MS: no deformity or atrophy  Skin: warm and dry, no rash Neuro:  Strength and sensation are intact Psych: euthymic mood, full affect   EKG:   10/04/14 normal sinus rhythm at 60 bpm.  Left bundle branch block is unchanged.  Since the previous tracing her PVCs have disappeared. 09/18/15  SR rate 59 normal    Recent Labs: No results found for requested labs within last 8760 hours.    Lipid Panel No results found for: CHOL, TRIG, HDL, CHOLHDL, VLDL, LDLCALC, LDLDIRECT    Wt Readings from Last 3 Encounters:  09/18/15 228 lb 12.8 oz (103.8 kg)  10/04/14 228 lb (103.4 kg)  06/23/14 228 lb (103.4 kg)         ASSESSMENT  AND PLAN:  1.  Left bundle branch block, currently asymptomatic.  Myoview showed no evidence of ischemia.  She has normal left ventricular systolic function. ECG normal today  2.  Essential hypertension controlled on current medications 3.  Plantar fasciitis. Continue orthotics and f/u [facility] 4. Neuropathy  qhs Neurontin stable no diabetes  5. Chol:  Continue statin labs with Dr Alicia Hogan  Disposition:     Current medicines are reviewed at length with the patient today.  The patient does not have concerns regarding medicines.  The following changes have been made:  no change  Labs/ tests ordered today include:   Orders Placed This Encounter  Procedures  . EKG 12-Lead     F/U in a year   Jenkins Rouge

## 2015-09-18 NOTE — Patient Instructions (Addendum)

## 2015-10-04 DIAGNOSIS — M5416 Radiculopathy, lumbar region: Secondary | ICD-10-CM | POA: Diagnosis not present

## 2015-10-29 ENCOUNTER — Other Ambulatory Visit: Payer: Self-pay | Admitting: *Deleted

## 2015-10-29 DIAGNOSIS — M5416 Radiculopathy, lumbar region: Secondary | ICD-10-CM | POA: Diagnosis not present

## 2015-10-29 MED ORDER — METOPROLOL SUCCINATE ER 25 MG PO TB24: 25.0000 mg | ORAL_TABLET | Freq: Every day | ORAL | 3 refills | Status: AC

## 2015-10-30 MED FILL — METOPROLOL SUCC ER 25 MG TA: 25 | 90 days supply | Qty: 90 | Fill #0

## 2015-11-06 ENCOUNTER — Other Ambulatory Visit (HOSPITAL_COMMUNITY)
Admission: RE | Admit: 2015-11-06 | Discharge: 2015-11-06 | Disposition: A | Discharge status: Home / Self Care | Payer: 59 | Source: Ambulatory Visit | Attending: Family Medicine | Admitting: Family Medicine

## 2015-11-06 ENCOUNTER — Other Ambulatory Visit: Payer: Self-pay | Admitting: Family Medicine

## 2015-11-06 DIAGNOSIS — E785 Hyperlipidemia, unspecified: Secondary | ICD-10-CM | POA: Diagnosis not present

## 2015-11-06 DIAGNOSIS — Z1151 Encounter for screening for human papillomavirus (HPV): Secondary | ICD-10-CM | POA: Diagnosis not present

## 2015-11-06 DIAGNOSIS — Z01411 Encounter for gynecological examination (general) (routine) with abnormal findings: Secondary | ICD-10-CM | POA: Insufficient documentation

## 2015-11-06 DIAGNOSIS — I2510 Atherosclerotic heart disease of native coronary artery without angina pectoris: Secondary | ICD-10-CM | POA: Diagnosis not present

## 2015-11-06 DIAGNOSIS — R7303 Prediabetes: Secondary | ICD-10-CM | POA: Diagnosis not present

## 2015-11-06 DIAGNOSIS — I10 Essential (primary) hypertension: Secondary | ICD-10-CM | POA: Diagnosis not present

## 2015-11-06 DIAGNOSIS — Z1159 Encounter for screening for other viral diseases: Secondary | ICD-10-CM | POA: Diagnosis not present

## 2015-11-06 DIAGNOSIS — M5416 Radiculopathy, lumbar region: Secondary | ICD-10-CM | POA: Diagnosis not present

## 2015-11-06 DIAGNOSIS — Z124 Encounter for screening for malignant neoplasm of cervix: Secondary | ICD-10-CM | POA: Diagnosis not present

## 2015-11-06 DIAGNOSIS — Z0000 Encounter for general adult medical examination without abnormal findings: Secondary | ICD-10-CM | POA: Diagnosis not present

## 2015-11-07 LAB — CYTOLOGY - PAP

## 2015-11-11 HISTORY — PX: BACK SURGERY: SHX140

## 2015-11-19 MED FILL — LOSARTAN POTASSIUM 50 MG TA: 50 | 90 days supply | Qty: 90 | Fill #1

## 2015-11-19 MED FILL — LOVASTATIN 40 MG TABLET: 40 | 90 days supply | Qty: 45 | Fill #4

## 2015-11-23 DIAGNOSIS — M5126 Other intervertebral disc displacement, lumbar region: Secondary | ICD-10-CM | POA: Diagnosis not present

## 2015-11-23 DIAGNOSIS — M5127 Other intervertebral disc displacement, lumbosacral region: Secondary | ICD-10-CM | POA: Diagnosis not present

## 2015-11-23 DIAGNOSIS — M5416 Radiculopathy, lumbar region: Secondary | ICD-10-CM | POA: Diagnosis not present

## 2015-11-23 MED FILL — HYDROCODON-APAP 5-325: 5-325 | 5 days supply | Qty: 60 | Fill #0

## 2015-11-23 MED FILL — diazePAM 5 MG TABS: 5 | 10 days supply | Qty: 40 | Fill #0

## 2015-11-29 ENCOUNTER — Other Ambulatory Visit: Payer: Self-pay | Admitting: Acute Care

## 2015-11-29 DIAGNOSIS — Z87891 Personal history of nicotine dependence: Secondary | ICD-10-CM

## 2015-12-06 MED FILL — MELOXICAM 15 MG TABLET: 15 | 30 days supply | Qty: 30 | Fill #0

## 2015-12-06 MED FILL — GABAPENTIN 300 MG CAPSULE: 300 | 90 days supply | Qty: 180 | Fill #3

## 2015-12-10 ENCOUNTER — Ambulatory Visit (INDEPENDENT_AMBULATORY_CARE_PROVIDER_SITE_OTHER): Payer: 59 | Admitting: Acute Care

## 2015-12-10 ENCOUNTER — Ambulatory Visit (INDEPENDENT_AMBULATORY_CARE_PROVIDER_SITE_OTHER)
Admission: RE | Admit: 2015-12-10 | Discharge: 2015-12-10 | Disposition: A | Discharge status: Home / Self Care | Payer: 59 | Source: Ambulatory Visit | Attending: Acute Care | Admitting: Acute Care

## 2015-12-10 ENCOUNTER — Telehealth: Payer: Self-pay | Admitting: Acute Care

## 2015-12-10 ENCOUNTER — Encounter: Payer: Self-pay | Admitting: Acute Care

## 2015-12-10 DIAGNOSIS — Z87891 Personal history of nicotine dependence: Secondary | ICD-10-CM

## 2015-12-10 NOTE — Telephone Encounter (Signed)
SG pt is calling back to speak with you about updating some information she gave you.

## 2015-12-10 NOTE — Telephone Encounter (Signed)
I have called Alicia Hogan and we have updated her smoking  quit date as 04/2008.

## 2015-12-10 NOTE — Progress Notes (Signed)
Shared Decision Making Visit Lung Cancer Screening Program [phone])   Eligibility:  Age 65 y.o.  Pack Years Smoking History Calculation 43 pack year smoking history (# packs/per year x # years smoked)  Recent History of coughing up blood  no  Unexplained weight loss? no ( >Than 15 pounds within the last 6 months )  Prior History Lung / other cancer no (Diagnosis within the last 5 years already requiring surveillance chest CT Scans).  Smoking Status Former Smoker  Former Smokers: Years since quit: 7 years  Quit Date: 04/11/2008  Visit Components:  Discussion included one or more decision making aids. yes  Discussion included risk/benefits of screening. yes  Discussion included potential follow up diagnostic testing for abnormal scans. yes  Discussion included meaning and risk of over diagnosis. yes  Discussion included meaning and risk of False Positives. yes  Discussion included meaning of total radiation exposure. yes  Counseling Included:  Importance of adherence to annual lung cancer LDCT screening. yes  Impact of comorbidities on ability to participate in the program. yes  Ability and willingness to under diagnostic treatment. yes  Smoking Cessation Counseling:  Current Smokers:   Discussed importance of smoking cessation. NA Former smoker  Information about tobacco cessation classes and interventions provided to patient.NA Former smoker  Patient provided with "ticket" for LDCT Scan. yes  Symptomatic Patient. no  CounselingNA  Diagnosis Code: Tobacco Use Z72.0  Asymptomatic Patient yes  Counseling NA Former smoker  Former Smokers:   Discussed the importance of maintaining cigarette abstinence. yes  Diagnosis Code: Personal History of Nicotine Dependence. [id number]  Information about tobacco cessation classes and interventions provided to patient. Yes  Patient provided with "ticket" for LDCT Scan. yes  Written Order for Lung Cancer Screening  with LDCT placed in Epic. Yes (CT Chest Lung Cancer Screening Low Dose W/O CM) [id number] Z12.2-Screening of respiratory organs Z87.891-Personal history of nicotine dependence  I spent 20 minutes of face to face time with Ms. Kopelman  discussing the risks and benefits of lung cancer screening. We viewed a power point together that explained in detail the above noted topics. We took the time to pause the power point at intervals to allow for questions to be asked and answered to ensure understanding. We discussed that she had taken the single most powerful action possible to decrease her risk of developing lung cancer when she quit smoking. I counseled her to remain smoke free, and to contact me if she ever had the desire to smoke again so that I can provide resources and tools to help support the effort to remain smoke free. We discussed the time and location of the scan, and that either [place] or I will call with the results within  24-48 hours of receiving them. She  has my card and contact information in the event she needs to speak with me, in addition to a copy of the power point we reviewed as a resource. She verbalized understanding of all of the above and had no further questions upon leaving the office.    Magdalen Spatz, NP 12/10/2015

## 2015-12-12 ENCOUNTER — Telehealth: Payer: Self-pay | Admitting: Acute Care

## 2015-12-12 DIAGNOSIS — Z87891 Personal history of nicotine dependence: Secondary | ICD-10-CM

## 2015-12-12 NOTE — Telephone Encounter (Signed)
I attempted to call the patient with her CT screening results. There was no answer. I left a message requesting that she call the office for results with the office contact information. We will await her return call.

## 2015-12-14 ENCOUNTER — Telehealth: Payer: Self-pay | Admitting: Acute Care

## 2015-12-14 NOTE — Telephone Encounter (Signed)
I have called the results of Mrs. Alicia Hogan's low dose screening CT to her. Her scan was read as a Lung RADS 2: nodules that are benign in appearance and behavior with a very low likelihood of becoming a clinically active cancer due to size or lack of growth. Recommendation per radiology is for a repeat LDCT in 12 months. I told her we will order and schedule the scan for October 2018. We discussed that the scan indicated emphysema, and aortic atherosclerosis and coronary artery calcifications. She is currently taking lovastatin per her primary care physician's instructions. She verbalized understanding of the above and had no questions. She has my contact information in the event she has questions in the future.

## 2015-12-26 MED FILL — predniSONE 10 MG TABS: 10 | 12 days supply | Qty: 48 | Fill #0

## 2016-01-11 MED FILL — GAVILYTE-N SOLUTION: 420 | 1 days supply | Qty: 4000 | Fill #0

## 2016-01-17 MED FILL — MELOXICAM 15 MG TABLET: 15 | 30 days supply | Qty: 30 | Fill #1

## 2016-01-21 DIAGNOSIS — K635 Polyp of colon: Secondary | ICD-10-CM | POA: Diagnosis not present

## 2016-01-21 DIAGNOSIS — K5730 Diverticulosis of large intestine without perforation or abscess without bleeding: Secondary | ICD-10-CM | POA: Diagnosis not present

## 2016-01-21 DIAGNOSIS — Z86010 Personal history of colonic polyps: Secondary | ICD-10-CM | POA: Diagnosis not present

## 2016-01-21 DIAGNOSIS — D126 Benign neoplasm of colon, unspecified: Secondary | ICD-10-CM | POA: Diagnosis not present

## 2016-01-21 DIAGNOSIS — D124 Benign neoplasm of descending colon: Secondary | ICD-10-CM | POA: Diagnosis not present

## 2016-01-21 DIAGNOSIS — D123 Benign neoplasm of transverse colon: Secondary | ICD-10-CM | POA: Diagnosis not present

## 2016-01-28 MED FILL — METOPROLOL SUCC ER 25 MG TA: 25 | 90 days supply | Qty: 90 | Fill #1

## 2016-01-28 MED FILL — predniSONE 10 MG TABS: 10 | 12 days supply | Qty: 48 | Fill #0

## 2016-02-18 MED FILL — LOVASTATIN 40 MG TABLET: 40 | 90 days supply | Qty: 45 | Fill #0

## 2016-02-18 MED FILL — LOSARTAN POTASSIUM 50 MG TA: 50 | 90 days supply | Qty: 90 | Fill #2

## 2016-03-10 MED FILL — GABAPENTIN 300 MG CAPSULE: 300 | 90 days supply | Qty: 180 | Fill #0

## 2016-03-12 ENCOUNTER — Other Ambulatory Visit (HOSPITAL_COMMUNITY): Payer: Self-pay | Admitting: Neurological Surgery

## 2016-03-12 DIAGNOSIS — M5416 Radiculopathy, lumbar region: Secondary | ICD-10-CM

## 2016-03-12 DIAGNOSIS — Z6834 Body mass index (BMI) 34.0-34.9, adult: Secondary | ICD-10-CM | POA: Diagnosis not present

## 2016-03-12 DIAGNOSIS — I10 Essential (primary) hypertension: Secondary | ICD-10-CM | POA: Diagnosis not present

## 2016-03-14 DIAGNOSIS — Z9889 Other specified postprocedural states: Secondary | ICD-10-CM | POA: Diagnosis not present

## 2016-03-14 DIAGNOSIS — M5416 Radiculopathy, lumbar region: Secondary | ICD-10-CM | POA: Diagnosis not present

## 2016-03-14 DIAGNOSIS — M4727 Other spondylosis with radiculopathy, lumbosacral region: Secondary | ICD-10-CM | POA: Diagnosis not present

## 2016-03-14 DIAGNOSIS — M5137 Other intervertebral disc degeneration, lumbosacral region: Secondary | ICD-10-CM | POA: Diagnosis not present

## 2016-03-14 DIAGNOSIS — M4807 Spinal stenosis, lumbosacral region: Secondary | ICD-10-CM | POA: Diagnosis not present

## 2016-03-18 ENCOUNTER — Ambulatory Visit (HOSPITAL_COMMUNITY)
Admission: RE | Admit: 2016-03-18 | Discharge: 2016-03-18 | Disposition: A | Discharge status: Home / Self Care | Payer: 59 | Source: Ambulatory Visit | Attending: Neurological Surgery | Admitting: Neurological Surgery

## 2016-03-18 DIAGNOSIS — M4807 Spinal stenosis, lumbosacral region: Secondary | ICD-10-CM | POA: Diagnosis not present

## 2016-03-18 DIAGNOSIS — M8938 Hypertrophy of bone, other site: Secondary | ICD-10-CM | POA: Insufficient documentation

## 2016-03-18 DIAGNOSIS — M5416 Radiculopathy, lumbar region: Secondary | ICD-10-CM | POA: Diagnosis not present

## 2016-03-18 DIAGNOSIS — M5126 Other intervertebral disc displacement, lumbar region: Secondary | ICD-10-CM | POA: Diagnosis not present

## 2016-03-18 DIAGNOSIS — M47897 Other spondylosis, lumbosacral region: Secondary | ICD-10-CM | POA: Insufficient documentation

## 2016-03-18 LAB — POCT I-STAT CREATININE: Creatinine, Ser: 0.9 mg/dL (ref 0.44–1.00)

## 2016-03-18 MED ORDER — GADOBENATE DIMEGLUMINE 529 MG/ML IV SOLN
20.0000 mL | Freq: Once | INTRAVENOUS | Status: AC
  Administered 2016-03-18: 20 mL via INTRAVENOUS

## 2016-03-20 DIAGNOSIS — Z6834 Body mass index (BMI) 34.0-34.9, adult: Secondary | ICD-10-CM | POA: Diagnosis not present

## 2016-03-20 DIAGNOSIS — M5416 Radiculopathy, lumbar region: Secondary | ICD-10-CM | POA: Diagnosis not present

## 2016-03-20 DIAGNOSIS — I10 Essential (primary) hypertension: Secondary | ICD-10-CM | POA: Diagnosis not present

## 2016-03-21 ENCOUNTER — Other Ambulatory Visit: Payer: Self-pay | Admitting: Neurological Surgery

## 2016-04-01 ENCOUNTER — Encounter (HOSPITAL_COMMUNITY): Payer: Self-pay

## 2016-04-01 ENCOUNTER — Encounter (HOSPITAL_COMMUNITY)
Admission: RE | Admit: 2016-04-01 | Discharge: 2016-04-01 | Disposition: A | Discharge status: Home / Self Care | Payer: 59 | Source: Ambulatory Visit | Attending: Neurological Surgery | Admitting: Neurological Surgery

## 2016-04-01 ENCOUNTER — Other Ambulatory Visit (HOSPITAL_COMMUNITY): Payer: Self-pay | Admitting: *Deleted

## 2016-04-01 DIAGNOSIS — Z01812 Encounter for preprocedural laboratory examination: Secondary | ICD-10-CM | POA: Insufficient documentation

## 2016-04-01 HISTORY — DX: Unspecified osteoarthritis, unspecified site: M19.90

## 2016-04-01 HISTORY — DX: Chronic obstructive pulmonary disease, unspecified: J44.9

## 2016-04-01 HISTORY — DX: Cardiac arrhythmia, unspecified: I49.9

## 2016-04-01 HISTORY — DX: Gastro-esophageal reflux disease without esophagitis: K21.9

## 2016-04-01 LAB — CBC
HCT: 43 % (ref 36.0–46.0)
HEMOGLOBIN: 14.3 g/dL (ref 12.0–15.0)
MCH: 30.6 pg (ref 26.0–34.0)
MCHC: 33.3 g/dL (ref 30.0–36.0)
MCV: 92.1 fL (ref 78.0–100.0)
Platelets: 250 10*3/uL (ref 150–400)
RBC: 4.67 MIL/uL (ref 3.87–5.11)
RDW: 13 % (ref 11.5–15.5)
WBC: 7.3 10*3/uL (ref 4.0–10.5)

## 2016-04-01 LAB — TYPE AND SCREEN
ABO/RH(D): B POS
ANTIBODY SCREEN: NEGATIVE

## 2016-04-01 LAB — BASIC METABOLIC PANEL
ANION GAP: 12 (ref 5–15)
BUN: 15 mg/dL (ref 6–20)
CO2: 22 mmol/L (ref 22–32)
Calcium: 10.2 mg/dL (ref 8.9–10.3)
Chloride: 105 mmol/L (ref 101–111)
Creatinine, Ser: 0.89 mg/dL (ref 0.44–1.00)
GFR calc Af Amer: >60 mL/min (ref >60)
GFR calc non Af Amer: >60 mL/min (ref >60)
GLUCOSE: 97 mg/dL (ref 65–99)
Potassium: 4.5 mmol/L (ref 3.5–5.1)
Sodium: 139 mmol/L (ref 135–145)

## 2016-04-01 LAB — SURGICAL PCR SCREEN
MRSA, PCR: NEGATIVE
STAPHYLOCOCCUS AUREUS: POSITIVE — AB

## 2016-04-01 LAB — ABO/RH: ABO/RH(D): B POS

## 2016-04-01 NOTE — Progress Notes (Signed)
Mupirocin Ointment called into [facility] for positive PCR of Staph. Pt notified and voiced understanding.

## 2016-04-01 NOTE — Pre-Procedure Instructions (Signed)
PUJA LAFLIN  04/01/2016    Your procedure is scheduled on Tuesday, April 08, 2016 at 12:00 N.   Report to [facility] Entrance "A" Admitting Office at 9:00 AM.   Call this number if you have problems the morning of surgery: [phone]   Questions prior to day of surgery, please call [phone] between 8 & 4 PM.   Remember:  Do not eat food or drink liquids after midnight Monday, 04/07/16.  Take these medicines the morning of surgery with A SIP OF WATER: Metoprolol (Toprol XL)  Stop Multivitamins and NSAIDS (Ibuprofen, Aleve, etc) 5 days prior to surgery.   Do not wear jewelry, make-up or nail polish.  Do not wear lotions, powders or perfumes.  Do not shave 48 hours prior to surgery.    Do not bring valuables to the hospital.  [facility] is not responsible for any belongings or valuables.  Contacts, dentures or bridgework may not be worn into surgery.  Leave your suitcase in the car.  After surgery it may be brought to your room.  For patients admitted to the hospital, discharge time will be determined by your treatment team.  Special instructions: [place] - Preparing for Surgery  Before surgery, you can play an important role.  Because skin is not sterile, your skin needs to be as free of germs as possible.  You can reduce the number of germs on you skin by washing with CHG (chlorahexidine gluconate) soap before surgery.  CHG is an antiseptic cleaner which kills germs and bonds with the skin to continue killing germs even after washing.  Please DO NOT use if you have an allergy to CHG or antibacterial soaps.  If your skin becomes reddened/irritated stop using the CHG and inform your nurse when you arrive at Short Stay.  Do not shave (including legs and underarms) for at least 48 hours prior to the first CHG shower.  You may shave your face.  Please follow these instructions carefully:   1.  Shower with CHG Soap the night before surgery and the                     morning of Surgery.  2.  If you choose to wash your hair, wash your hair first as usual with your       normal shampoo.  3.  After you shampoo, rinse your hair and body thoroughly to remove the shampoo.  4.  Use CHG as you would any other liquid soap.  You can apply chg directly       to the skin and wash gently with scrungie or a clean washcloth.  5.  Apply the CHG Soap to your body ONLY FROM THE NECK DOWN.        Do not use on open wounds or open sores.  Avoid contact with your eyes, ears, mouth and genitals (private parts).  Wash genitals (private parts) with your normal soap.  6.  Wash thoroughly, paying special attention to the area where your surgery        will be performed.  7.  Thoroughly rinse your body with warm water from the neck down.  8.  DO NOT shower/wash with your normal soap after using and rinsing off       the CHG Soap.  9.  Pat yourself dry with a clean towel.            10.  Wear clean pajamas.  11.  Place clean sheets on your bed the night of your first shower and do not        sleep with pets.  Day of Surgery  Do not apply any lotions the morning of surgery.  Please wear clean clothes to the hospital.   Please read over the fact sheets that you were given.

## 2016-04-01 NOTE — Progress Notes (Signed)
Pt denies cardiac history except for a dysrhythmia - right bundle branch block. States she saw Dr. Johnsie Cancel last year and was told she no longer had it. Pt denies any chest pain or sob. Pt states she is not diabetic.

## 2016-04-02 MED FILL — MUPIROCIN 2% OINTMENT: 2 | 5 days supply | Qty: 22 | Fill #0

## 2016-04-02 NOTE — Progress Notes (Signed)
Pt called and stated that the pharmacy did not have the Rx. I called [facility] and spoke with a tech. She stated that there was nothing on their voicemail this morning. I gave her Rx information.

## 2016-04-07 MED ORDER — LIDOCAINE-EPINEPHRINE (PF) 1 %-1:200000 IJ SOLN
INTRAMUSCULAR | Status: AC
  Filled 2016-04-07: qty 30

## 2016-04-08 ENCOUNTER — Inpatient Hospital Stay (HOSPITAL_COMMUNITY): Payer: 59 | Admitting: Anesthesiology

## 2016-04-08 ENCOUNTER — Inpatient Hospital Stay (HOSPITAL_COMMUNITY)
Admission: RE | Admit: 2016-04-08 | Discharge: 2016-04-10 | DRG: 460 | Disposition: A | Discharge status: Home / Self Care | Payer: 59 | Source: Ambulatory Visit | Attending: Neurological Surgery | Admitting: Neurological Surgery

## 2016-04-08 ENCOUNTER — Inpatient Hospital Stay (HOSPITAL_COMMUNITY): Payer: 59

## 2016-04-08 ENCOUNTER — Encounter (HOSPITAL_COMMUNITY)
Admission: RE | Disposition: A | Discharge status: Home / Self Care | Payer: Self-pay | Source: Ambulatory Visit | Attending: Neurological Surgery

## 2016-04-08 DIAGNOSIS — E785 Hyperlipidemia, unspecified: Secondary | ICD-10-CM | POA: Diagnosis not present

## 2016-04-08 DIAGNOSIS — Z7982 Long term (current) use of aspirin: Secondary | ICD-10-CM | POA: Diagnosis not present

## 2016-04-08 DIAGNOSIS — Z87891 Personal history of nicotine dependence: Secondary | ICD-10-CM | POA: Diagnosis not present

## 2016-04-08 DIAGNOSIS — K219 Gastro-esophageal reflux disease without esophagitis: Secondary | ICD-10-CM | POA: Diagnosis present

## 2016-04-08 DIAGNOSIS — M4807 Spinal stenosis, lumbosacral region: Secondary | ICD-10-CM | POA: Diagnosis not present

## 2016-04-08 DIAGNOSIS — M4727 Other spondylosis with radiculopathy, lumbosacral region: Principal | ICD-10-CM | POA: Diagnosis present

## 2016-04-08 DIAGNOSIS — I10 Essential (primary) hypertension: Secondary | ICD-10-CM | POA: Diagnosis present

## 2016-04-08 DIAGNOSIS — Z79899 Other long term (current) drug therapy: Secondary | ICD-10-CM | POA: Diagnosis not present

## 2016-04-08 DIAGNOSIS — Z8249 Family history of ischemic heart disease and other diseases of the circulatory system: Secondary | ICD-10-CM

## 2016-04-08 DIAGNOSIS — M79604 Pain in right leg: Secondary | ICD-10-CM | POA: Diagnosis present

## 2016-04-08 DIAGNOSIS — M48061 Spinal stenosis, lumbar region without neurogenic claudication: Secondary | ICD-10-CM | POA: Diagnosis not present

## 2016-04-08 DIAGNOSIS — J449 Chronic obstructive pulmonary disease, unspecified: Secondary | ICD-10-CM | POA: Diagnosis present

## 2016-04-08 DIAGNOSIS — M5417 Radiculopathy, lumbosacral region: Secondary | ICD-10-CM | POA: Diagnosis not present

## 2016-04-08 DIAGNOSIS — Z888 Allergy status to other drugs, medicaments and biological substances status: Secondary | ICD-10-CM

## 2016-04-08 DIAGNOSIS — I447 Left bundle-branch block, unspecified: Secondary | ICD-10-CM | POA: Diagnosis not present

## 2016-04-08 DIAGNOSIS — Z885 Allergy status to narcotic agent status: Secondary | ICD-10-CM | POA: Diagnosis not present

## 2016-04-08 DIAGNOSIS — M5416 Radiculopathy, lumbar region: Secondary | ICD-10-CM | POA: Diagnosis present

## 2016-04-08 DIAGNOSIS — Z419 Encounter for procedure for purposes other than remedying health state, unspecified: Secondary | ICD-10-CM

## 2016-04-08 DIAGNOSIS — M4326 Fusion of spine, lumbar region: Secondary | ICD-10-CM | POA: Diagnosis not present

## 2016-04-08 HISTORY — PX: OTHER SURGICAL HISTORY: SHX169

## 2016-04-08 SURGERY — POSTERIOR LUMBAR FUSION 1 LEVEL
Anesthesia: General | Site: Back

## 2016-04-08 MED ORDER — MIDAZOLAM HCL 2 MG/2ML IJ SOLN
INTRAMUSCULAR | Status: DC | PRN
  Administered 2016-04-08: 2 mg via INTRAVENOUS

## 2016-04-08 MED ORDER — ONDANSETRON HCL 4 MG/2ML IJ SOLN
4.0000 mg | Freq: Four times a day (QID) | INTRAMUSCULAR | Status: AC | PRN
  Administered 2016-04-08: 4 mg via INTRAVENOUS

## 2016-04-08 MED ORDER — DEXAMETHASONE SODIUM PHOSPHATE 10 MG/ML IJ SOLN
INTRAMUSCULAR | Status: AC
  Filled 2016-04-08: qty 1

## 2016-04-08 MED ORDER — CHLORHEXIDINE GLUCONATE CLOTH 2 % EX PADS: 6.0000 | MEDICATED_PAD | Freq: Once | CUTANEOUS | Status: DC

## 2016-04-08 MED ORDER — FENTANYL CITRATE (PF) 100 MCG/2ML IJ SOLN
INTRAMUSCULAR | Status: DC | PRN
  Administered 2016-04-08 (×4): 50 ug via INTRAVENOUS

## 2016-04-08 MED ORDER — SODIUM CHLORIDE 0.9 % IV SOLN
INTRAVENOUS | Status: DC
  Administered 2016-04-08: 19:00:00 via INTRAVENOUS

## 2016-04-08 MED ORDER — BISACODYL 10 MG RE SUPP: 10.0000 mg | Freq: Every day | RECTAL | Status: DC | PRN

## 2016-04-08 MED ORDER — METHOCARBAMOL 1000 MG/10ML IJ SOLN
500.0000 mg | Freq: Four times a day (QID) | INTRAVENOUS | Status: DC | PRN
  Filled 2016-04-08: qty 5

## 2016-04-08 MED ORDER — SUGAMMADEX SODIUM 200 MG/2ML IV SOLN
INTRAVENOUS | Status: DC | PRN
  Administered 2016-04-08: 209 mg via INTRAVENOUS

## 2016-04-08 MED ORDER — SODIUM CHLORIDE 0.9 % IR SOLN
Status: DC | PRN
  Administered 2016-04-08: 13:00:00

## 2016-04-08 MED ORDER — LACTATED RINGERS IV SOLN
INTRAVENOUS | Status: DC
  Administered 2016-04-08: 10:00:00 via INTRAVENOUS

## 2016-04-08 MED ORDER — HYDROCODONE-ACETAMINOPHEN 5-325 MG PO TABS
1.0000 | ORAL_TABLET | ORAL | Status: DC | PRN
  Administered 2016-04-09 – 2016-04-10 (×7): 2 via ORAL
  Filled 2016-04-08 (×7): qty 2

## 2016-04-08 MED ORDER — HYDROMORPHONE HCL 1 MG/ML IJ SOLN
INTRAMUSCULAR | Status: AC
  Administered 2016-04-08: 0.5 mg via INTRAVENOUS
  Filled 2016-04-08: qty 0.5

## 2016-04-08 MED ORDER — LACTATED RINGERS IV SOLN
INTRAVENOUS | Status: DC | PRN
  Administered 2016-04-08 (×2): via INTRAVENOUS

## 2016-04-08 MED ORDER — ALUM & MAG HYDROXIDE-SIMETH 200-200-20 MG/5ML PO SUSP: 30.0000 mL | Freq: Four times a day (QID) | ORAL | Status: DC | PRN

## 2016-04-08 MED ORDER — PROPOFOL 10 MG/ML IV BOLUS
INTRAVENOUS | Status: DC | PRN
  Administered 2016-04-08: 180 mg via INTRAVENOUS

## 2016-04-08 MED ORDER — ACETAMINOPHEN 650 MG RE SUPP: 650.0000 mg | Freq: Four times a day (QID) | RECTAL | Status: DC | PRN

## 2016-04-08 MED ORDER — SODIUM CHLORIDE 0.9 % IV SOLN: 250.0000 mL | INTRAVENOUS | Status: DC

## 2016-04-08 MED ORDER — HYDROMORPHONE HCL 1 MG/ML IJ SOLN
0.2500 mg | INTRAMUSCULAR | Status: DC | PRN
  Administered 2016-04-08 (×2): 0.5 mg via INTRAVENOUS

## 2016-04-08 MED ORDER — PROMETHAZINE HCL 25 MG/ML IJ SOLN
12.5000 mg | Freq: Four times a day (QID) | INTRAMUSCULAR | Status: DC | PRN
  Administered 2016-04-08 – 2016-04-09 (×2): 12.5 mg via INTRAVENOUS
  Filled 2016-04-08: qty 1

## 2016-04-08 MED ORDER — CEFAZOLIN SODIUM-DEXTROSE 2-4 GM/100ML-% IV SOLN
2.0000 g | INTRAVENOUS | Status: AC
  Administered 2016-04-08 (×2): 2 g via INTRAVENOUS

## 2016-04-08 MED ORDER — ACETAMINOPHEN 650 MG RE SUPP
650.0000 mg | RECTAL | Status: DC | PRN
Start: 2016-04-08 — End: 2016-04-10

## 2016-04-08 MED ORDER — 0.9 % SODIUM CHLORIDE (POUR BTL) OPTIME
TOPICAL | Status: DC | PRN
  Administered 2016-04-08: 1000 mL

## 2016-04-08 MED ORDER — BUPIVACAINE HCL (PF) 0.5 % IJ SOLN
INTRAMUSCULAR | Status: DC | PRN
  Administered 2016-04-08: 4 mL
  Administered 2016-04-08: 25 mL

## 2016-04-08 MED ORDER — THROMBIN 20000 UNITS EX SOLR
CUTANEOUS | Status: DC | PRN
  Administered 2016-04-08: 13:00:00 via TOPICAL

## 2016-04-08 MED ORDER — CEFAZOLIN SODIUM-DEXTROSE 2-4 GM/100ML-% IV SOLN
INTRAVENOUS | Status: AC
  Filled 2016-04-08: qty 100

## 2016-04-08 MED ORDER — ROCURONIUM BROMIDE 10 MG/ML (PF) SYRINGE
PREFILLED_SYRINGE | INTRAVENOUS | Status: DC | PRN
  Administered 2016-04-08: 50 mg via INTRAVENOUS
  Administered 2016-04-08: 30 mg via INTRAVENOUS
  Administered 2016-04-08 (×2): 20 mg via INTRAVENOUS

## 2016-04-08 MED ORDER — LIDOCAINE-EPINEPHRINE 2 %-1:100000 IJ SOLN
INTRAMUSCULAR | Status: DC | PRN
  Administered 2016-04-08: 4 mL via INTRADERMAL

## 2016-04-08 MED ORDER — SENNA 8.6 MG PO TABS
1.0000 | ORAL_TABLET | Freq: Two times a day (BID) | ORAL | Status: DC
  Administered 2016-04-09: 8.6 mg via ORAL
  Filled 2016-04-08 (×4): qty 1

## 2016-04-08 MED ORDER — METHOCARBAMOL 500 MG PO TABS
500.0000 mg | ORAL_TABLET | Freq: Four times a day (QID) | ORAL | Status: DC | PRN
  Administered 2016-04-09 – 2016-04-10 (×4): 500 mg via ORAL
  Filled 2016-04-08 (×4): qty 1

## 2016-04-08 MED ORDER — ONDANSETRON HCL 4 MG/2ML IJ SOLN
INTRAMUSCULAR | Status: AC
  Filled 2016-04-08: qty 2

## 2016-04-08 MED ORDER — FLEET ENEMA 7-19 GM/118ML RE ENEM: 1.0000 | ENEMA | Freq: Once | RECTAL | Status: DC | PRN

## 2016-04-08 MED ORDER — PHENYLEPHRINE HCL 10 MG/ML IJ SOLN
INTRAVENOUS | Status: DC | PRN
  Administered 2016-04-08: 15 ug/min via INTRAVENOUS

## 2016-04-08 MED ORDER — KETOROLAC TROMETHAMINE 15 MG/ML IJ SOLN
15.0000 mg | Freq: Three times a day (TID) | INTRAMUSCULAR | Status: AC
  Administered 2016-04-08 – 2016-04-09 (×4): 15 mg via INTRAVENOUS
  Filled 2016-04-08 (×3): qty 1

## 2016-04-08 MED ORDER — ONDANSETRON HCL 4 MG/2ML IJ SOLN
4.0000 mg | INTRAMUSCULAR | Status: DC | PRN
  Administered 2016-04-08: 4 mg via INTRAVENOUS
  Filled 2016-04-08: qty 2

## 2016-04-08 MED ORDER — MENTHOL 3 MG MT LOZG: 1.0000 | LOZENGE | OROMUCOSAL | Status: DC | PRN

## 2016-04-08 MED ORDER — METOPROLOL SUCCINATE ER 25 MG PO TB24
25.0000 mg | ORAL_TABLET | Freq: Every day | ORAL | Status: DC
  Administered 2016-04-09 – 2016-04-10 (×2): 25 mg via ORAL
  Filled 2016-04-08 (×2): qty 1

## 2016-04-08 MED ORDER — PHENYLEPHRINE 40 MCG/ML (10ML) SYRINGE FOR IV PUSH (FOR BLOOD PRESSURE SUPPORT)
PREFILLED_SYRINGE | INTRAVENOUS | Status: DC | PRN
  Administered 2016-04-08 (×2): 80 ug via INTRAVENOUS
  Administered 2016-04-08: 40 ug via INTRAVENOUS

## 2016-04-08 MED ORDER — LIDOCAINE 2% (20 MG/ML) 5 ML SYRINGE
INTRAMUSCULAR | Status: DC | PRN
  Administered 2016-04-08: 60 mg via INTRAVENOUS

## 2016-04-08 MED ORDER — THROMBIN 5000 UNITS EX SOLR
OROMUCOSAL | Status: DC | PRN
  Administered 2016-04-08: 13:00:00 via TOPICAL

## 2016-04-08 MED ORDER — HYDROMORPHONE HCL 1 MG/ML IJ SOLN
1.0000 mg | INTRAMUSCULAR | Status: DC | PRN
  Administered 2016-04-08 – 2016-04-09 (×2): 1 mg via INTRAVENOUS
  Filled 2016-04-08 (×2): qty 1

## 2016-04-08 MED ORDER — LIDOCAINE-EPINEPHRINE 2 %-1:100000 IJ SOLN
INTRAMUSCULAR | Status: AC
  Filled 2016-04-08: qty 1

## 2016-04-08 MED ORDER — MIDAZOLAM HCL 2 MG/2ML IJ SOLN
INTRAMUSCULAR | Status: AC
  Filled 2016-04-08: qty 2

## 2016-04-08 MED ORDER — OXYCODONE HCL 5 MG/5ML PO SOLN: 5.0000 mg | Freq: Once | ORAL | Status: DC | PRN

## 2016-04-08 MED ORDER — ACETAMINOPHEN 325 MG PO TABS: 650.0000 mg | ORAL_TABLET | Freq: Four times a day (QID) | ORAL | Status: DC | PRN

## 2016-04-08 MED ORDER — LOSARTAN POTASSIUM 50 MG PO TABS
50.0000 mg | ORAL_TABLET | Freq: Every day | ORAL | Status: DC
  Administered 2016-04-09 – 2016-04-10 (×2): 50 mg via ORAL
  Filled 2016-04-08 (×2): qty 1

## 2016-04-08 MED ORDER — DOCUSATE SODIUM 100 MG PO CAPS
100.0000 mg | ORAL_CAPSULE | Freq: Two times a day (BID) | ORAL | Status: DC
  Administered 2016-04-08 – 2016-04-10 (×4): 100 mg via ORAL
  Filled 2016-04-08 (×4): qty 1

## 2016-04-08 MED ORDER — KETOROLAC TROMETHAMINE 15 MG/ML IJ SOLN
INTRAMUSCULAR | Status: AC
  Administered 2016-04-08: 15 mg via INTRAVENOUS
  Filled 2016-04-08: qty 1

## 2016-04-08 MED ORDER — PHENOL 1.4 % MT LIQD: 1.0000 | OROMUCOSAL | Status: DC | PRN

## 2016-04-08 MED ORDER — GABAPENTIN 300 MG PO CAPS
600.0000 mg | ORAL_CAPSULE | Freq: Every day | ORAL | Status: DC
  Administered 2016-04-08 – 2016-04-09 (×2): 600 mg via ORAL
  Filled 2016-04-08 (×2): qty 2

## 2016-04-08 MED ORDER — DEXAMETHASONE SODIUM PHOSPHATE 10 MG/ML IJ SOLN
INTRAMUSCULAR | Status: DC | PRN
  Administered 2016-04-08: 10 mg via INTRAVENOUS

## 2016-04-08 MED ORDER — THROMBIN 5000 UNITS EX SOLR
CUTANEOUS | Status: AC
  Filled 2016-04-08: qty 5000

## 2016-04-08 MED ORDER — ACETAMINOPHEN 325 MG PO TABS: 650.0000 mg | ORAL_TABLET | ORAL | Status: DC | PRN

## 2016-04-08 MED ORDER — POLYETHYLENE GLYCOL 3350 17 G PO PACK: 17.0000 g | PACK | Freq: Every day | ORAL | Status: DC | PRN

## 2016-04-08 MED ORDER — PRAVASTATIN SODIUM 40 MG PO TABS
40.0000 mg | ORAL_TABLET | Freq: Every day | ORAL | Status: DC
  Administered 2016-04-09: 20 mg via ORAL
  Filled 2016-04-08: qty 1

## 2016-04-08 MED ORDER — CEFAZOLIN SODIUM-DEXTROSE 2-4 GM/100ML-% IV SOLN
2.0000 g | Freq: Three times a day (TID) | INTRAVENOUS | Status: AC
  Administered 2016-04-08 – 2016-04-09 (×2): 2 g via INTRAVENOUS
  Filled 2016-04-08 (×2): qty 100

## 2016-04-08 MED ORDER — HYDROMORPHONE HCL 1 MG/ML IJ SOLN
INTRAMUSCULAR | Status: AC
  Administered 2016-04-08: 0.5 mg via INTRAVENOUS
  Filled 2016-04-08: qty 1

## 2016-04-08 MED ORDER — ROCURONIUM BROMIDE 50 MG/5ML IV SOSY
PREFILLED_SYRINGE | INTRAVENOUS | Status: AC
  Filled 2016-04-08: qty 10

## 2016-04-08 MED ORDER — OXYCODONE HCL 5 MG PO TABS: 5.0000 mg | ORAL_TABLET | Freq: Once | ORAL | Status: DC | PRN

## 2016-04-08 MED ORDER — ONDANSETRON HCL 4 MG/2ML IJ SOLN
INTRAMUSCULAR | Status: DC | PRN
  Administered 2016-04-08: 4 mg via INTRAVENOUS

## 2016-04-08 MED ORDER — HYDROCODONE-ACETAMINOPHEN 7.5-325 MG PO TABS
1.0000 | ORAL_TABLET | Freq: Four times a day (QID) | ORAL | Status: DC
  Administered 2016-04-08 – 2016-04-09 (×2): 1 via ORAL
  Filled 2016-04-08 (×2): qty 1

## 2016-04-08 MED ORDER — THROMBIN 20000 UNITS EX SOLR
CUTANEOUS | Status: AC
  Filled 2016-04-08: qty 20000

## 2016-04-08 MED ORDER — SODIUM CHLORIDE 0.9% FLUSH
3.0000 mL | Freq: Two times a day (BID) | INTRAVENOUS | Status: DC
  Administered 2016-04-08 – 2016-04-09 (×3): 3 mL via INTRAVENOUS

## 2016-04-08 MED ORDER — ROCURONIUM BROMIDE 50 MG/5ML IV SOSY
PREFILLED_SYRINGE | INTRAVENOUS | Status: AC
  Filled 2016-04-08: qty 5

## 2016-04-08 MED ORDER — FENTANYL CITRATE (PF) 100 MCG/2ML IJ SOLN
INTRAMUSCULAR | Status: AC
  Filled 2016-04-08: qty 4

## 2016-04-08 MED ORDER — SODIUM CHLORIDE 0.9% FLUSH: 3.0000 mL | INTRAVENOUS | Status: DC | PRN

## 2016-04-08 SURGICAL SUPPLY — 68 items
ADH SKN CLS APL DERMABOND .7 (GAUZE/BANDAGES/DRESSINGS) ×1
APL SRG 60D 8 XTD TIP BNDBL (TIP)
BAG DECANTER FOR FLEXI CONT (MISCELLANEOUS) ×3 IMPLANT
BASKET BONE COLLECTION (BASKET) ×3 IMPLANT
BLADE CLIPPER SURG (BLADE) IMPLANT
BONE EQUIVA 10CC (Bone Implant) ×2 IMPLANT
BUR MATCHSTICK NEURO 3.0 LAGG (BURR) ×3 IMPLANT
CANISTER SUCT 3000ML PPV (MISCELLANEOUS) ×3 IMPLANT
CARTRIDGE OIL MAESTRO DRILL (MISCELLANEOUS) ×1 IMPLANT
CONT SPEC 4OZ CLIKSEAL STRL BL (MISCELLANEOUS) ×3 IMPLANT
COVER BACK TABLE 60X90IN (DRAPES) ×3 IMPLANT
COVER BACK TABLE 80X110 HD (DRAPES) ×2 IMPLANT
DECANTER SPIKE VIAL GLASS SM (MISCELLANEOUS) ×1 IMPLANT
DERMABOND ADVANCED (GAUZE/BANDAGES/DRESSINGS) ×2
DERMABOND ADVANCED .7 DNX12 (GAUZE/BANDAGES/DRESSINGS) ×1 IMPLANT
DEVICE DISSECT PLASMABLAD 3.0S (MISCELLANEOUS) ×1 IMPLANT
DIFFUSER DRILL AIR PNEUMATIC (MISCELLANEOUS) ×1 IMPLANT
DRAPE C-ARM 42X72 X-RAY (DRAPES) ×6 IMPLANT
DRAPE HALF SHEET 40X57 (DRAPES) IMPLANT
DRAPE LAPAROTOMY 100X72X124 (DRAPES) ×3 IMPLANT
DRAPE POUCH INSTRU U-SHP 10X18 (DRAPES) ×3 IMPLANT
DRSG OPSITE POSTOP 4X8 (GAUZE/BANDAGES/DRESSINGS) ×2 IMPLANT
DURAPREP 26ML APPLICATOR (WOUND CARE) ×3 IMPLANT
DURASEAL APPLICATOR TIP (TIP) IMPLANT
DURASEAL SPINE SEALANT 3ML (MISCELLANEOUS) IMPLANT
ELECT REM PT RETURN 9FT ADLT (ELECTROSURGICAL) ×3
ELECTRODE REM PT RTRN 9FT ADLT (ELECTROSURGICAL) ×1 IMPLANT
GAUZE SPONGE 4X4 12PLY STRL (GAUZE/BANDAGES/DRESSINGS) ×3 IMPLANT
GAUZE SPONGE 4X4 16PLY XRAY LF (GAUZE/BANDAGES/DRESSINGS) IMPLANT
GLOVE BIOGEL PI IND STRL 8.5 (GLOVE) ×2 IMPLANT
GLOVE BIOGEL PI INDICATOR 8.5 (GLOVE) ×4
GLOVE ECLIPSE 7.0 STRL STRAW (GLOVE) ×2 IMPLANT
GLOVE ECLIPSE 8.5 STRL (GLOVE) ×6 IMPLANT
GLOVE INDICATOR 7.5 STRL GRN (GLOVE) ×2 IMPLANT
GLOVE SURG SS PI 6.5 STRL IVOR (GLOVE) ×4 IMPLANT
GOWN STRL REUS W/ TWL LRG LVL3 (GOWN DISPOSABLE) IMPLANT
GOWN STRL REUS W/ TWL XL LVL3 (GOWN DISPOSABLE) IMPLANT
GOWN STRL REUS W/TWL 2XL LVL3 (GOWN DISPOSABLE) ×6 IMPLANT
GOWN STRL REUS W/TWL LRG LVL3 (GOWN DISPOSABLE) ×3
GOWN STRL REUS W/TWL XL LVL3 (GOWN DISPOSABLE)
HEMOSTAT POWDER KIT SURGIFOAM (HEMOSTASIS) ×2 IMPLANT
KIT BASIN OR (CUSTOM PROCEDURE TRAY) ×3 IMPLANT
KIT ROOM TURNOVER OR (KITS) ×3 IMPLANT
MILL MEDIUM DISP (BLADE) ×2 IMPLANT
NEEDLE HYPO 22GX1.5 SAFETY (NEEDLE) ×3 IMPLANT
NS IRRIG 1000ML POUR BTL (IV SOLUTION) ×3 IMPLANT
OIL CARTRIDGE MAESTRO DRILL (MISCELLANEOUS)
PACK LAMINECTOMY NEURO (CUSTOM PROCEDURE TRAY) ×3 IMPLANT
PAD ARMBOARD 7.5X6 YLW CONV (MISCELLANEOUS) ×9 IMPLANT
PATTIES SURGICAL .5 X1 (DISPOSABLE) ×3 IMPLANT
PLASMABLADE 3.0S (MISCELLANEOUS) ×3
ROD TI ALLOY CVD VIT 5.5X35MM (Rod) ×4 IMPLANT
SCREW VITALITY PA 6.5X45MM (Screw) ×4 IMPLANT
SCREW VITALITY PA 6.5X50MM (Screw) ×4 IMPLANT
SPACER ZYSTON STR 11X25X10X8 (Spacer) ×4 IMPLANT
SPONGE LAP 4X18 X RAY DECT (DISPOSABLE) IMPLANT
SPONGE SURGIFOAM ABS GEL 100 (HEMOSTASIS) ×3 IMPLANT
SUT PROLENE 6 0 BV (SUTURE) IMPLANT
SUT VIC AB 1 CT1 18XBRD ANBCTR (SUTURE) ×1 IMPLANT
SUT VIC AB 1 CT1 8-18 (SUTURE) ×9
SUT VIC AB 2-0 CP2 18 (SUTURE) ×7 IMPLANT
SUT VIC AB 3-0 SH 8-18 (SUTURE) ×5 IMPLANT
SYR 3ML LL SCALE MARK (SYRINGE) ×12 IMPLANT
TOP CLOSURE 5.5-6.0 SHEAR TOP (Neuro Prosthesis/Implant) ×8 IMPLANT
TOWEL GREEN STERILE (TOWEL DISPOSABLE) ×2 IMPLANT
TOWEL GREEN STERILE FF (TOWEL DISPOSABLE) ×1 IMPLANT
TRAY FOLEY W/METER SILVER 16FR (SET/KITS/TRAYS/PACK) ×3 IMPLANT
WATER STERILE IRR 1000ML POUR (IV SOLUTION) ×3 IMPLANT

## 2016-04-08 NOTE — H&P (Signed)
Alicia Hogan is an 66 y.o. female.   Chief Complaint: back and right lower extremity pain. HPI: patient is a 66 year old individual who is had a decompression extra foraminally at L5-S1 on the right side.  He's had persistent pain with lumbar radicular symptoms that have not gone away and she has evidence for degenerative changes at L5-S1.  After some consideration she was advised regarding surgical decompression via posterior lumbar interbody arthrodesis at the L5-S1 level.  She is now admitted for the surgery.  Past Medical History:  Diagnosis Date  . Arthritis   . COPD (chronic obstructive pulmonary disease) ([place])   . Dysrhythmia    RBB  . GERD (gastroesophageal reflux disease)   . Hypertension   . Plantar fasciitis 12/28/2013    Past Surgical History:  Procedure Laterality Date  . BACK SURGERY  11/2015   lumbar foraminotomy  . COLONOSCOPY    . MENISCUS REPAIR Left 2012  . TONSILLECTOMY     and adenoidectomy    Family History  Problem Relation Age of Onset  . Hypertension Mother   . Alzheimer's disease Mother   . Lung cancer Father   . Stroke Maternal Grandmother   . Diabetes Maternal Grandmother   . Heart attack Neg Hx    Social History:  reports that she quit smoking about 7 years ago. Her smoking use included Cigarettes. She started smoking about 47 years ago. She has a 43.00 pack-year smoking history. She has never used smokeless tobacco. She reports that she does not drink alcohol or use drugs.  Allergies:  Allergies  Allergen Reactions  . Lisinopril Cough  . Codeine Nausea And Vomiting    Facility-Administered Medications Prior to Admission  Medication Dose Route Frequency Provider Last Rate Last Dose  . Arch Bandage MISC 2 each  2 each Does not apply Once Myeong Roxine Caddy, DPM       Medications Prior to Admission  Medication Sig Dispense Refill  . aspirin 81 MG tablet Take 81 mg by mouth daily.    . calcium carbonate (TUMS - DOSED IN MG ELEMENTAL CALCIUM)  500 MG chewable tablet Chew 1 tablet by mouth daily as needed for indigestion or heartburn.    . gabapentin (NEURONTIN) 300 MG capsule Take 600 mg by mouth at bedtime. Reported on 06/04/2015    . losartan (COZAAR) 50 MG tablet Take 50 mg by mouth daily.    Marland Kitchen lovastatin (MEVACOR) 40 MG tablet Take 20 mg by mouth every evening.  2  . metoprolol succinate (TOPROL XL) 25 MG 24 hr tablet Take 1 tablet (25 mg total) by mouth daily. 90 tablet 3  . Multiple Vitamins-Minerals (MULTIVITAMIN PO) Take 1 tablet by mouth daily.    Marland Kitchen ibuprofen (ADVIL,MOTRIN) 200 MG tablet Take 800 mg by mouth daily as needed for moderate pain.      No results found for this or any previous visit (from the past 48 hour(s)). No results found.  Review of Systems  Constitutional: Negative.   HENT: Negative.   Eyes: Negative.   Respiratory: Negative.   Cardiovascular: Negative.   Gastrointestinal: Negative.   Genitourinary: Negative.   Musculoskeletal: Positive for back pain.  Skin: Negative.   Neurological: Positive for focal weakness.  Endo/Heme/Allergies: Negative.   Psychiatric/Behavioral: Negative.     Blood pressure (!) 155/80, pulse 82, temperature 97.9 F (36.6 C), temperature source Oral, resp. rate 18, height 5\' 8"  (1.727 m), weight 104.5 kg (230 lb 7 oz), SpO2 100 %. Physical Exam  Constitutional: She is oriented to person, place, and time. She appears well-developed and well-nourished.  HENT:  Head: Normocephalic and atraumatic.  Eyes: Conjunctivae and EOM are normal. Pupils are equal, round, and reactive to light.  Neck: Normal range of motion. Neck supple.  Cardiovascular: Normal rate and regular rhythm.   Respiratory: Effort normal and breath sounds normal.  GI: Soft. Bowel sounds are normal.  Musculoskeletal:  Moderate centralized back pain.  Positive straight leg raising bilaterally at 30.  Patrick's maneuver is positive on the right side.  Absent deep tendon reflexes in the right Achilles.   Neurological: She is alert and oriented to person, place, and time.  Mild weakness in the right gastroc.  Cranial nerve examination is normal.  Station and gait are intact save for antalgia on right lower extremity.  Skin: Skin is warm and dry.  Psychiatric: She has a normal mood and affect. Her behavior is normal. Judgment and thought content normal.     Assessment/Plan Spondylosis L5-S1 with radiculopathy.  Posterior lumbar interbody arthrodesis L5-S1 with pedicle screw fixation L5-S1.  Earleen Newport, MD 04/08/2016, 11:24 AM

## 2016-04-08 NOTE — Anesthesia Procedure Notes (Signed)
Procedure Name: Intubation Date/Time: 04/08/2016 12:25 PM Performed by: Mervyn Gay Pre-anesthesia Checklist: Patient identified, Patient being monitored, Timeout performed, Emergency Drugs available and Suction available Patient Re-evaluated:Patient Re-evaluated prior to inductionOxygen Delivery Method: Circle System Utilized Preoxygenation: Pre-oxygenation with 100% oxygen Intubation Type: IV induction Ventilation: Mask ventilation without difficulty Laryngoscope Size: Miller and 3 Grade View: Grade II Tube type: Oral Tube size: 7.5 mm Number of attempts: 1 Airway Equipment and Method: Stylet Placement Confirmation: ETT inserted through vocal cords under direct vision,  positive ETCO2 and breath sounds checked- equal and bilateral Secured at: 22 cm Tube secured with: Tape Dental Injury: Teeth and Oropharynx as per pre-operative assessment

## 2016-04-08 NOTE — Op Note (Signed)
Procedure: L5-S1 decompressive laminectomy decompression of S1 and L5 nerve roots, posterior lumbar interbody arthrodesis with peek spacers local autograft and allograft, pedicle screw fixation L5-S1 posterior lateral arthrodesis L5 S1.  Surgeon: Kristeen Miss M.D.  Asst.: You wish none Nunudkumar M.D.  Indications: Patient is Alicia Hogan is a 66 y.o. female who who's had significant back pain and lumbar radiculopathy for over a years period time. 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 she was advised regarding surgical intervention having failed a previous decompression using a Metrix technique extraforaminal he at L5-S1 on the right.  Procedure: The patient was brought to the operating room supine on a stretcher. After the smooth induction of general endotracheal anesthesia she was turned prone and the back was prepped with alcohol and DuraPrep. The back was then draped sterilely. A midline incision was created and carried down to the lumbar dorsal fascia. A localizing radiograph identified the L5 spinous processes. A subligamentous dissection was created at L5-S1 to expose the interlaminar space at L5-S1 and the facet joints over the L5-S1 interspace. Laminotomies were were then created removing the entire inferior margin of the lamina of L5 including the inferior facet at the L5-S1 joint. The yellow ligament was taken up and the common dural tube was exposed along with the L5 nerve root superiorly, and the S1 nerve root inferiorly, the disc space was exposed and epidural veins in this region were cauterized and divided. The L5 nerve roots and the S1 nerve root were dissected with care taken to protect them. The disc space was opened and a combination of curettes and rongeurs was used to evacuate the disc space fully. The endplates were removed using sharp curettes. An interbody spacer was placed to distract the disc space while the  contralateral discectomy was performed. When the entirety of the disc was removed and the endplates were prepared final sizing of the disc space was obtained 11 mm peek spacers with 8 of lordosis were chosen and packed with autograft and allograft and placed into the interspace. The remainder of the interspace was packed with autograft and allograft. Pedicle entry sites were then chosen using fluoroscopic guidance and explain 5 x 45 mm screws were placed in L5 and 6.5 x 50 mm screws were placed in S1. The lateral gutters were decorticated and graft was packed in the posterolateral gutters between L5 and S1. Final radiographs were obtained after placing appropriately sized rods between the pedicle screws at L5-S1 and torquing these to the appropriate tension. The surgical site was inspected carefully to assure the S1 and L5 nerve roots were well decompressed, hemostasis was obtained, and the graft was well packed. Then the retractors were removed and the wound was closed with #1 Vicryl in the lumbar dorsal fascia 2-0 Vicryl in the subcutaneous tissue and 3-0 Vicryl subcuticularly. When he cc of half percent Marcaine was injected into the paraspinous musculature at the time of closure. Blood loss was estimated at 250 cc. The patient tolerated procedure well and was returned to the recovery room in stable condition.

## 2016-04-08 NOTE — Progress Notes (Signed)
Orthopedic Tech Progress Note Patient Details:  SHANENA SKRZYPEK 1951-02-07 [id number] Patient brace was a pre-fit Patient ID: Gerri Lins, female   DOB: 02-20-1950, 66 y.o.   MRN: [id number]   Braulio Bosch 04/08/2016, 6:07 PM

## 2016-04-08 NOTE — Transfer of Care (Signed)
Immediate Anesthesia Transfer of Care Note  Patient: Alicia Hogan  Procedure(s) Performed: Procedure(s) with comments: Lumbar five-Sacral one Posterior lumbar interbody fusion (N/A) - L5-S1 Posterior lumbar interbody fusion  Patient Location: PACU  Anesthesia Type:General  Level of Consciousness: awake, alert  and oriented  Airway & Oxygen Therapy: Patient Spontanous Breathing and Patient connected to nasal cannula oxygen  Post-op Assessment: Report given to RN and Post -op Vital signs reviewed and stable  Post vital signs: Reviewed and stable  Last Vitals:  Vitals:   04/08/16 0933  BP: (!) 155/80  Pulse: 82  Resp: 18  Temp: 36.6 C    Last Pain:  Vitals:   04/08/16 0933  TempSrc: Oral      Patients Stated Pain Goal: 3 ([id number])  Complications: No apparent anesthesia complications

## 2016-04-08 NOTE — Anesthesia Preprocedure Evaluation (Signed)
Anesthesia Evaluation  Patient identified by MRN, date of birth, ID band Patient awake    Reviewed: Allergy & Precautions, H&P , NPO status , Patient's Chart, lab work & pertinent test results  Airway Mallampati: II   Neck ROM: full    Dental   Pulmonary COPD, former smoker,    breath sounds clear to auscultation       Cardiovascular hypertension,  Rhythm:regular Rate:Normal  RBBB   Neuro/Psych    GI/Hepatic GERD  ,  Endo/Other    Renal/GU      Musculoskeletal  (+) Arthritis ,   Abdominal   Peds  Hematology   Anesthesia Other Findings   Reproductive/Obstetrics                             Anesthesia Physical Anesthesia Plan  ASA: III  Anesthesia Plan: General   Post-op Pain Management:    Induction: Intravenous  Airway Management Planned: Oral ETT  Additional Equipment:   Intra-op Plan:   Post-operative Plan: Extubation in OR  Informed Consent: I have reviewed the patients History and Physical, chart, labs and discussed the procedure including the risks, benefits and alternatives for the proposed anesthesia with the patient or authorized representative who has indicated his/her understanding and acceptance.     Plan Discussed with: CRNA, Anesthesiologist and Surgeon  Anesthesia Plan Comments:         Anesthesia Quick Evaluation

## 2016-04-09 LAB — CBC
HEMATOCRIT: 35.4 % — AB (ref 36.0–46.0)
Hemoglobin: 11.7 g/dL — ABNORMAL LOW (ref 12.0–15.0)
MCH: 30.5 pg (ref 26.0–34.0)
MCHC: 33.1 g/dL (ref 30.0–36.0)
MCV: 92.4 fL (ref 78.0–100.0)
PLATELETS: 223 10*3/uL (ref 150–400)
RBC: 3.83 MIL/uL — ABNORMAL LOW (ref 3.87–5.11)
RDW: 12.9 % (ref 11.5–15.5)
WBC: 12.7 10*3/uL — AB (ref 4.0–10.5)

## 2016-04-09 LAB — BASIC METABOLIC PANEL
ANION GAP: 10 (ref 5–15)
BUN: 12 mg/dL (ref 6–20)
CO2: 23 mmol/L (ref 22–32)
CREATININE: 0.96 mg/dL (ref 0.44–1.00)
Calcium: 8.9 mg/dL (ref 8.9–10.3)
Chloride: 107 mmol/L (ref 101–111)
Glucose, Bld: 136 mg/dL — ABNORMAL HIGH (ref 65–99)
Potassium: 4.9 mmol/L (ref 3.5–5.1)
Sodium: 140 mmol/L (ref 135–145)

## 2016-04-09 MED ORDER — DEXAMETHASONE 4 MG PO TABS
2.0000 mg | ORAL_TABLET | Freq: Two times a day (BID) | ORAL | Status: DC
  Administered 2016-04-09 – 2016-04-10 (×3): 2 mg via ORAL
  Filled 2016-04-09 (×3): qty 1

## 2016-04-09 MED FILL — Heparin Sodium (Porcine) Inj 1000 Unit/ML: INTRAMUSCULAR | Qty: 30 | Status: AC

## 2016-04-09 MED FILL — Sodium Chloride IV Soln 0.9%: INTRAVENOUS | Qty: 1000 | Status: AC

## 2016-04-09 NOTE — Evaluation (Signed)
Physical Therapy Evaluation Patient Details Name: Alicia Hogan MRN: [id number] DOB: Nov 21, 1950 Today's Date: 04/09/2016   History of Present Illness  Pt is a 66 y/o female who presents s/p L5-S1 laminectomy/decompression and PLIF on 04/08/16.  Clinical Impression  Pt admitted with above diagnosis. Pt currently with functional limitations due to the deficits listed below (see PT Problem List). At the time of PT eval pt was able to perform transfers and ambulation with gross min guard assist for balance support and safety. Pt will benefit from skilled PT to increase their independence and safety with mobility to allow discharge to the venue listed below.       Follow Up Recommendations Outpatient PT    Equipment Recommendations  None recommended by PT    Recommendations for Other Services       Precautions / Restrictions Precautions Precautions: Fall;Back Precaution Booklet Issued: Yes (comment) Precaution Comments: Reviewed handout and pt was cued for precautions during functional mobility.  Required Braces or Orthoses: Spinal Brace Spinal Brace: Lumbar corset;Applied in sitting position Restrictions Weight Bearing Restrictions: No      Mobility  Bed Mobility Overal bed mobility: Needs Assistance Bed Mobility: Rolling;Sidelying to Sit Rolling: Supervision Sidelying to sit: Min guard       General bed mobility comments: VC's for proper log roll technique and hands-on guarding as pt elevated trunk to full sitting position.   Transfers Overall transfer level: Needs assistance Equipment used: None Transfers: Sit to/from Stand Sit to Stand: Min guard         General transfer comment: Close guard for balance support and safety as pt powered-up to full stand. Increased time to gain/maintain balance prior to initiatiating gait training.   Ambulation/Gait Ambulation/Gait assistance: Min assist;Min guard Ambulation Distance (Feet): 300 Feet Assistive device: None Gait  Pattern/deviations: Step-through pattern;Decreased stride length;Trunk flexed Gait velocity: Decreased Gait velocity interpretation: Below normal speed for age/gender General Gait Details: Slow and guarded gait. Initially requiring occasional min assist for balance support, progressing to min guard during gait training. VC's for improved posture throughout.   Stairs            Wheelchair Mobility    Modified Rankin (Stroke Patients Only)       Balance Overall balance assessment: Needs assistance Sitting-balance support: Feet supported;No upper extremity supported Sitting balance-Leahy Scale: Fair     Standing balance support: No upper extremity supported;During functional activity Standing balance-Leahy Scale: Fair                               Pertinent Vitals/Pain Pain Assessment: Faces Faces Pain Scale: Hurts little more Pain Location: Incision site Pain Descriptors / Indicators: Operative site guarding;Discomfort Pain Intervention(s): Limited activity within patient's tolerance;Monitored during session;Repositioned    Home Living Family/patient expects to be discharged to:: Private residence Living Arrangements: Spouse/significant other Available Help at Discharge: Family;Available 24 hours/day Type of Home: House Home Access: Stairs to enter   [employer] of Steps: 1 Home Layout: One level Home Equipment: Shower seat - built in;Hand held shower head      Prior Function Level of Independence: Independent         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[employer] Hand: Right    Extremity/Trunk Assessment   Upper Extremity Assessment Upper Extremity Assessment: Defer to OT evaluation    Lower Extremity Assessment Lower Extremity Assessment: Generalized weakness (Consistent  with pre-op diagnosis)    Cervical / Trunk Assessment Cervical / Trunk Assessment: Other exceptions Cervical / Trunk Exceptions: s/p  surgery  Communication   Communication: No difficulties  Cognition Arousal/Alertness: Awake/alert Behavior During Therapy: WFL for tasks assessed/performed Overall Cognitive Status: Within Functional Limits for tasks assessed                      General Comments      Exercises     Assessment/Plan    PT Assessment Patient needs continued PT services  PT Problem List Decreased strength;Decreased range of motion;Decreased activity tolerance;Decreased balance;Decreased mobility;Decreased knowledge of use of DME;Decreased safety awareness;Decreased knowledge of precautions;Pain       PT Treatment Interventions DME instruction;Gait training;Stair training;Functional mobility training;Therapeutic activities;Therapeutic exercise;Neuromuscular re-education;Patient/family education    PT Goals (Current goals can be found in the Care Plan section)  Acute Rehab PT Goals Patient Stated Goal: Get back to ballroom dancing PT Goal Formulation: With patient Time For Goal Achievement: 04/16/16 Potential to Achieve Goals: Good    Frequency Min 5X/week   Barriers to discharge        Co-evaluation               End of Session Equipment Utilized During Treatment: Back brace Activity Tolerance: Patient tolerated treatment well Patient left: in chair;with call bell/phone within reach Nurse Communication: Mobility status PT Visit Diagnosis: Pain;Other abnormalities of gait and mobility (R26.89) Pain - part of body:  (Back)         Time: [id number] PT Time Calculation (min) (ACUTE ONLY): 26 min   Charges:   PT Evaluation $PT Eval Moderate Complexity: 1 Procedure PT Treatments $Gait Training: 8-22 mins   PT G Codes:         Thelma Comp 04/09/2016, 8:28 AM   Rolinda Roan, PT, DPT Acute Rehabilitation Services Pager: [phone]

## 2016-04-09 NOTE — Progress Notes (Signed)
Patient ID: Alicia Hogan, female   DOB: 01-20-1951, 66 y.o.   MRN: [id number] I'll signs are stable Motor function appears good in lower extremities Patient is ambulating Nausea postoperatively yesterday's resolving We'll see how she does overnight Possible discharge tomorrow

## 2016-04-09 NOTE — Consult Note (Signed)
   [facility] CM Inpatient Consult   04/09/2016  Alicia Hogan July 20, 1950 [id number]    Went to room to speak with Alicia Hogan on behalf of [employer] to Wellness program for [employer] employees/dependents with [employer]. Discussed Link to [employer]. She denies having any Link to Wellness needs at this time. She is agreeable to post hospital discharge call. Confirmed best number as [phone]. Provided Link to [employer] along with contact information. Appreciative of visit.    Marthenia Rolling, MSN-Ed, RN,BSN [facility]iaison [phone]

## 2016-04-09 NOTE — Anesthesia Postprocedure Evaluation (Signed)
Anesthesia Post Note  Patient: Alicia Hogan  Procedure(s) Performed: Procedure(s) (LRB): Lumbar five-Sacral one Posterior lumbar interbody fusion (N/A)  Patient location during evaluation: PACU Anesthesia Type: General Level of consciousness: awake and alert and patient cooperative Pain management: pain level controlled Vital Signs Assessment: post-procedure vital signs reviewed and stable Respiratory status: spontaneous breathing and respiratory function stable Cardiovascular status: stable Anesthetic complications: no       Last Vitals:  Vitals:   04/09/16 0450 04/09/16 0804  BP: 137/69 114/62  Pulse: 97 95  Resp: 20 18  Temp: 37.3 C 36.4 C    Last Pain:  Vitals:   04/09/16 0625  TempSrc:   PainSc: Echo

## 2016-04-09 NOTE — Evaluation (Signed)
Occupational Therapy Evaluation Patient Details Name: Alicia Hogan MRN: [id number] DOB: 10-18-1950 Today's Date: 04/09/2016    History of Present Illness Pt is a 66 y/o female who presents s/p L5-S1 laminectomy/decompression and PLIF on 04/08/16.   Clinical Impression   Patient evaluated by Occupational Therapy with no further acute OT needs identified. All education has been completed and the patient has no further questions. See below for any follow-up Occupational Therapy or equipment needs. OT to sign off. Thank you for referral.      Follow Up Recommendations  No OT follow up    Equipment Recommendations  None recommended by OT    Recommendations for Other Services       Precautions / Restrictions Precautions Precautions: Fall;Back Precaution Booklet Issued: Yes (comment) Precaution Comments: handout provided and reviewed for adls. Required Braces or Orthoses: Spinal Brace Spinal Brace: Lumbar corset;Applied in sitting position Restrictions Weight Bearing Restrictions: No      Mobility Bed Mobility Overal bed mobility: Needs Assistance Bed Mobility: Rolling;Sidelying to Sit Rolling: Supervision Sidelying to sit: Min guard       General bed mobility comments: in chair   Transfers Overall transfer level: Needs assistance Equipment used: None Transfers: Sit to/from Stand Sit to Stand: Supervision         General transfer comment: Close guard for balance support and safety as pt powered-up to full stand. Increased time to gain/maintain balance prior to initiatiating gait training.     Balance Overall balance assessment: Needs assistance Sitting-balance support: Feet supported;No upper extremity supported Sitting balance-Leahy Scale: Fair     Standing balance support: No upper extremity supported;During functional activity Standing balance-Leahy Scale: Fair                              ADL Overall ADL's : Needs  assistance/impaired Eating/Feeding: Independent   Grooming: Wash/dry hands   Upper Body Bathing: Independent   Lower Body Bathing: Minimal assistance       Lower Body Dressing: Supervision/safety;With adaptive equipment;Sit to/from stand Lower Body Dressing Details (indicate cue type and reason): able to use reacher to be supervision. pt unable to cross R Le but can cross L LE Toilet Transfer: Supervision/safety           Functional mobility during ADLs: Supervision/safety   Back handout provided and reviewed adls in detail. Pt educated on: clothing between brace, never sleep in brace, set an alarm at night for medication, avoid sitting for long periods of time, correct bed positioning for sleeping, correct sequence for bed mobility, avoiding lifting more than 5 pounds and never wash directly over incision. All education is complete and patient indicates understanding. Pt educated on purchase of AE options. Pt plans to get reacher from gift shop.        Vision Baseline Vision/History: Wears glasses Wears Glasses: At all times       Perception     Praxis      Pertinent Vitals/Pain Pain Assessment: Faces Faces Pain Scale: Hurts little more Pain Location: Incision site Pain Descriptors / Indicators: Operative site guarding;Discomfort Pain Intervention(s): Monitored during session;Premedicated before session;Repositioned     Hand Dominance Right   Extremity/Trunk Assessment Upper Extremity Assessment Upper Extremity Assessment: Overall WFL for tasks assessed   Lower Extremity Assessment Lower Extremity Assessment: Defer to PT evaluation   Cervical / Trunk Assessment Cervical / Trunk Assessment: Other exceptions Cervical / Trunk Exceptions: s/p surgery   Communication Communication  Communication: No difficulties   Cognition Arousal/Alertness: Awake/alert Behavior During Therapy: WFL for tasks assessed/performed Overall Cognitive Status: Within Functional Limits  for tasks assessed                     General Comments       Exercises       Shoulder Instructions      Home Living Family/patient expects to be discharged to:: Private residence Living Arrangements: Spouse/significant other Available Help at Discharge: Family;Available 24 hours/day Type of Home: House Home Access: Stairs to enter [employer] of Steps: 1   Home Layout: One level     Bathroom Shower/Tub: Occupational psychologist: Standard Bathroom Accessibility: Yes   Home Equipment: Shower seat - built in;Hand held shower head   Additional Comments: daughter will (A) 24/7 and spouse in the PM      Prior Functioning/Environment Level of Independence: Independent        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[employer]        OT Problem List: Decreased strength;Decreased activity tolerance;Impaired balance (sitting and/or standing);Decreased safety awareness;Decreased knowledge of use of DME or AE;Decreased knowledge of precautions      OT Treatment/Interventions:      OT Goals(Current goals can be found in the care plan section) Acute Rehab OT Goals Patient Stated Goal: Get back to ballroom dancing OT Goal Formulation: With patient  OT Frequency:     Barriers to D/C:            Co-evaluation              End of Session Equipment Utilized During Treatment: Back brace Nurse Communication: Precautions  Activity Tolerance: Patient tolerated treatment well Patient left: in chair;with call bell/phone within reach  OT Visit Diagnosis: Muscle weakness (generalized) (M62.81)                ADL either performed or assessed with clinical judgement  Time: 0808-0829 OT Time Calculation (min): 21 min Charges:  OT General Charges $OT Visit: 1 Procedure OT Evaluation $OT Eval Moderate Complexity: 1 Procedure G-Codes:      Jeri Modena   OTR/L PagerIP:[phone] Office: [phone] .   Parke Poisson B 04/09/2016, 9:05 AM

## 2016-04-10 MED ORDER — METHOCARBAMOL 500 MG PO TABS: 500.0000 mg | ORAL_TABLET | Freq: Four times a day (QID) | ORAL | 3 refills | Status: DC | PRN

## 2016-04-10 MED ORDER — DEXAMETHASONE 1 MG PO TABS: ORAL_TABLET | ORAL | 0 refills | Status: DC

## 2016-04-10 MED ORDER — HYDROCODONE-ACETAMINOPHEN 7.5-325 MG PO TABS: 1.0000 | ORAL_TABLET | Freq: Four times a day (QID) | ORAL | 0 refills | Status: DC

## 2016-04-10 MED FILL — HYDROCODON-APAP 7.5-325: 7.5-325 | 8 days supply | Qty: 60 | Fill #0

## 2016-04-10 MED FILL — METHOCARBAMOL 500 MG TABLET: 500 | 10 days supply | Qty: 40 | Fill #0

## 2016-04-10 MED FILL — DEXAMETHASONE 1 MG TABLET: 1 | 6 days supply | Qty: 15 | Fill #0

## 2016-04-10 NOTE — Discharge Summary (Signed)
Physician Discharge Summary  Patient ID: Alicia Hogan MRN: [id number] DOB/AGE: [birth year] 66 y.o.  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 She tolerated surgery well. Her right lumbar radiculopathy is better.  Consults: None  Significant Diagnostic Studies: None  Treatments: surgery: Decompression arthrodesis L5-S1 with posterior lumbar interbody arthrodesis and pedicle screw fixation L5-S1.  Discharge Exam: Blood pressure 105/68, pulse 88, temperature 99.2 F (37.3 C), resp. rate 18, height 5\' 8"  (1.727 m), weight 104.5 kg (230 lb 7 oz), SpO2 97 %. Incision is clean and dry motor function is intact in lower extremities. Station and gait are intact.  Disposition: 01-Home or Self Care  Discharge Instructions    AMB Referral to [facility]    Complete by:  As directed    Please assign UMR member for post discharge call. Currently at [facility]. Thanks. Marthenia Rolling, MSN-Ed, RN,BSN [facility] [id number]   Reason for consult:  Please assign UMR member for post discharge call   Expected date of contact:  1-3 days (reserved for hospital discharges)   Call MD for:  redness, tenderness, or signs of infection (pain, swelling, redness, odor or green/yellow discharge around incision site)    Complete by:  As directed    Call MD for:  severe uncontrolled pain    Complete by:  As directed    Call MD for:  temperature >100.4    Complete by:  As directed    Diet - low sodium heart healthy    Complete by:  As directed    Discharge instructions    Complete by:  As directed    Okay to shower. Do not apply  salves or appointments to incision. No heavy lifting with the upper extremities greater than 15 pounds. May resume driving when not requiring pain medication and patient feels comfortable with doing so.   Incentive spirometry RT    Complete by:  As directed    Increase activity slowly    Complete by:  As directed      Allergies as of 04/10/2016      Reactions   Lisinopril Cough   Codeine Nausea And Vomiting      Medication List    TAKE these medications   aspirin 81 MG tablet Take 81 mg by mouth daily.   calcium carbonate 500 MG chewable tablet Commonly known as:  TUMS - dosed in mg elemental calcium Chew 1 tablet by mouth daily as needed for indigestion or heartburn.   gabapentin 300 MG capsule Commonly known as:  NEURONTIN Take 600 mg by mouth at bedtime. Reported on 06/04/2015   HYDROcodone-acetaminophen 7.5-325 MG tablet Commonly known as:  NORCO Take 1-2 tablets by mouth every 6 (six) hours.   ibuprofen 200 MG tablet Commonly known as:  ADVIL,MOTRIN Take 800 mg by mouth daily as needed for moderate pain.   losartan 50 MG tablet Commonly known as:  COZAAR Take 50 mg by mouth daily.   lovastatin 40 MG tablet Commonly known as:  MEVACOR Take 20 mg by mouth every evening.   methocarbamol 500 MG tablet Commonly known as:  ROBAXIN Take 1 tablet (500 mg total) by mouth every 6 (six) hours as needed for muscle spasms.  metoprolol succinate 25 MG 24 hr tablet Commonly known as:  TOPROL XL Take 1 tablet (25 mg total) by mouth daily.   MULTIVITAMIN PO Take 1 tablet by mouth daily.            Durable Medical Equipment        Start     Ordered   04/10/16 0854  DME 3-in-1  Once     04/10/16 0854   04/09/16 1759  For home use only DME 3 n 1  Once     04/09/16 1758       Signed: Earleen Newport 04/10/2016, 8:54 AM

## 2016-04-10 NOTE — Progress Notes (Signed)
Physical Therapy Treatment and Discharge Patient Details Name: Alicia Hogan MRN: [id number] DOB: 1951-02-08 Today's Date: 04/10/2016    History of Present Illness Pt is a 66 y/o female who presents s/p L5-S1 laminectomy/decompression and PLIF on 04/08/16.    PT Comments    Pt progressing well with functional mobility. Overall at a modified independent level this session and has met all PT goals. Pt able to recall 3/3 precautions without cues, and was educated on car transfer, walking program, and general safety at home. Will sign off at this time. If needs change, please reconsult.  Follow Up Recommendations  Outpatient PT     Equipment Recommendations  3in1 (PT)    Recommendations for Other Services       Precautions / Restrictions Precautions Precautions: Fall;Back Precaution Booklet Issued: Yes (comment) Precaution Comments: Pt was cued for precautions during functional mobility Required Braces or Orthoses: Spinal Brace Spinal Brace: Lumbar corset;Applied in sitting position Restrictions Weight Bearing Restrictions: No    Mobility  Bed Mobility               General bed mobility comments: Pt sitting up in recliner upon PT arrival   Transfers Overall transfer level: Modified independent Equipment used: None Transfers: Sit to/from Stand              Ambulation/Gait Ambulation/Gait assistance: Modified independent (Device/Increase time) Ambulation Distance (Feet): 300 Feet Assistive device: None Gait Pattern/deviations: Step-through pattern;Decreased stride length;Trunk flexed Gait velocity: Decreased Gait velocity interpretation: Below normal speed for age/gender General Gait Details: Good posture throughout gait training - no assist required.    Stairs Stairs: Yes   Stair Management: One rail Right;Step to pattern;Forwards Number of Stairs: 10 General stair comments: Good sequencing, no unsteadiness noted.   Wheelchair Mobility    Modified  [place] (Stroke Patients Only)       Balance Overall balance assessment: Needs assistance Sitting-balance support: Feet supported;No upper extremity supported Sitting balance-Leahy Scale: Good     Standing balance support: No upper extremity supported;During functional activity Standing balance-Leahy Scale: Fair                      Cognition Arousal/Alertness: Awake/alert Behavior During Therapy: WFL for tasks assessed/performed Overall Cognitive Status: Within Functional Limits for tasks assessed                      Exercises      General Comments        Pertinent Vitals/Pain Pain Assessment: Faces Faces Pain Scale: Hurts a little bit Pain Location: Incision site Pain Descriptors / Indicators: Operative site guarding;Discomfort Pain Intervention(s): Monitored during session    Home Living Family/patient expects to be discharged to:: Private residence Living Arrangements: Spouse/significant other Available Help at Discharge: Family;Available 24 hours/day                Prior Function            PT Goals (current goals can now be found in the care plan section) Acute Rehab PT Goals Patient Stated Goal: Home today PT Goal Formulation: With patient Time For Goal Achievement: 04/16/16 Potential to Achieve Goals: Good Progress towards PT goals: Goals met/education completed, patient discharged from PT    Frequency    Min 5X/week      PT Plan Current plan remains appropriate    Co-evaluation             End of Session Equipment Utilized During  Treatment: Back brace Activity Tolerance: Patient tolerated treatment well Patient left: in chair;with call bell/phone within reach Nurse Communication: Mobility status PT Visit Diagnosis: Pain;Other abnormalities of gait and mobility (R26.89) Pain - part of body:  (Back)     Time: 2346-8873 PT Time Calculation (min) (ACUTE ONLY): 13 min  Charges:  $Gait Training: 8-22 mins                     G Codes:       Thelma Comp 2016-05-08, 8:12 AM   Rolinda Roan, PT, DPT Acute Rehabilitation Services Pager: [phone]

## 2016-04-10 NOTE — Progress Notes (Signed)
Discharge instructions reviewed with pt. RXs given. All questions answered at this time. Transport home by family.   Ave Filter, RN

## 2016-04-11 ENCOUNTER — Other Ambulatory Visit: Payer: Self-pay | Admitting: *Deleted

## 2016-04-11 ENCOUNTER — Encounter: Payer: Self-pay | Admitting: *Deleted

## 2016-04-11 NOTE — Patient Outreach (Addendum)
[facility]) Care Management  04/11/2016  Alicia Hogan 09/05/1950 [id number]   Subjective: Telephone call to patient's home number, spoke with patient, and HIPAA verified.   Discussed [facility] Care Management UMR Transition of care follow up, patient voiced understanding, and is in agreement to complete follow up. Patient states she is doing ok, managing her pain with medications and adjusting her positioning.   States has her follow up appointments in place.  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  Patient states she has exhausted her family medical leave act [employer]) and is currently out on medical leave through ADA (American Disabilities Act).  States she is accessing [facility] benefits, short term disability in place, and receives her medications through [facility] outpatient pharmacy.   States she does not have the hospital indemnity plan.  Patient states she does not have any transition of care, care coordination, disease management, disease monitoring, transportation, community resource, or pharmacy needs at this time.   States she is very appreciative of the follow up and is in agreement to receive [facility] Management information.     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  Status post L5-S1 decompressive laminectomy decompression of S1 and L5 nerve roots, posterior lumbar interbody arthrodesis with peek spacers local autograft and allograft, pedicle screw fixation L5-S1 posterior lateral arthrodesis L5 S1 on 04/08/16.  Patient also has a history of hypertension and CAD.  Assessment:  Received UMR Transition of care referral on 04/09/16.    Transition of care referral completed, no care management needs, and will proceed with case closure.  Plan: RNCM will send patient successful outreach letter, [facility] pamphlet, and  magnet. RNCM will send case closure due to follow up completed / no care management needs request to [facility].   Kaylie Ritter H. Annia Friendly, BSN, [place] Management [facility] Telephonic CM Phone: [phone] Fax: [phone]

## 2016-04-17 MED FILL — HYDROCODON-APAP 7.5-325: 7.5-325 | 15 days supply | Qty: 60 | Fill #0

## 2016-04-18 MED FILL — METHOCARBAMOL 500 MG TABLET: 500 | 10 days supply | Qty: 40 | Fill #1

## 2016-04-28 MED FILL — METHOCARBAMOL 500 MG TABLET: 500 | 10 days supply | Qty: 40 | Fill #2

## 2016-04-28 MED FILL — METOPROLOL SUCC ER 25 MG TA: 25 | 90 days supply | Qty: 90 | Fill #2

## 2016-04-30 ENCOUNTER — Other Ambulatory Visit (HOSPITAL_COMMUNITY): Payer: Self-pay | Admitting: Neurological Surgery

## 2016-04-30 ENCOUNTER — Ambulatory Visit (HOSPITAL_COMMUNITY)
Admission: RE | Admit: 2016-04-30 | Discharge: 2016-04-30 | Disposition: A | Discharge status: Home / Self Care | Payer: 59 | Source: Ambulatory Visit | Attending: Family Medicine | Admitting: Family Medicine

## 2016-04-30 DIAGNOSIS — R52 Pain, unspecified: Secondary | ICD-10-CM | POA: Diagnosis not present

## 2016-04-30 DIAGNOSIS — M5416 Radiculopathy, lumbar region: Secondary | ICD-10-CM | POA: Diagnosis not present

## 2016-04-30 MED FILL — HYDROCODON-APAP 7.5-325: 7.5-325 | 15 days supply | Qty: 60 | Fill #0

## 2016-04-30 NOTE — Progress Notes (Signed)
**  Preliminary report by tech**  Bilateral lower extremity venous duplex completed. There is no evidence of deep or superficial vein thrombosis involving the right and left lower extremities. All visualized vessels appear patent and compressible. There is no evidence of Baker's cysts bilaterally. Results were given to [place] at [phone].  04/30/16 2:21 PM Carlos Levering RVT

## 2016-05-08 MED FILL — METHOCARBAMOL 500 MG TABLET: 500 | 10 days supply | Qty: 40 | Fill #3

## 2016-05-12 DIAGNOSIS — Z760 Encounter for issue of repeat prescription: Secondary | ICD-10-CM | POA: Diagnosis not present

## 2016-05-16 MED FILL — HYDROCODON-APAP 7.5-325: 7.5-325 | 15 days supply | Qty: 60 | Fill #0

## 2016-05-19 MED FILL — LOVASTATIN 40 MG TABLET: 40 | 90 days supply | Qty: 45 | Fill #1

## 2016-05-19 MED FILL — LOSARTAN POTASSIUM 50 MG TA: 50 | 90 days supply | Qty: 90 | Fill #3

## 2016-06-04 MED FILL — HYDROCODON-APAP 5-325: 5-325 | 15 days supply | Qty: 60 | Fill #0

## 2016-06-06 MED FILL — MELOXICAM 15 MG TABLET: 15 | 30 days supply | Qty: 30 | Fill #2

## 2016-06-06 MED FILL — GABAPENTIN 300 MG CAPSULE: 300 | 90 days supply | Qty: 180 | Fill #1

## 2016-07-02 ENCOUNTER — Other Ambulatory Visit (HOSPITAL_COMMUNITY): Payer: Self-pay | Admitting: Neurological Surgery

## 2016-07-02 DIAGNOSIS — M5416 Radiculopathy, lumbar region: Secondary | ICD-10-CM | POA: Diagnosis not present

## 2016-07-02 DIAGNOSIS — M545 Low back pain, unspecified: Secondary | ICD-10-CM

## 2016-07-08 DIAGNOSIS — I10 Essential (primary) hypertension: Secondary | ICD-10-CM | POA: Diagnosis not present

## 2016-07-08 MED FILL — LOSARTAN POTASSIUM 100 MG T: 100 | 90 days supply | Qty: 90 | Fill #0

## 2016-07-15 ENCOUNTER — Encounter: Payer: Self-pay | Admitting: Physical Therapy

## 2016-07-15 ENCOUNTER — Ambulatory Visit: Payer: 59 | Attending: Neurological Surgery | Admitting: Physical Therapy

## 2016-07-15 DIAGNOSIS — M6281 Muscle weakness (generalized): Secondary | ICD-10-CM | POA: Insufficient documentation

## 2016-07-15 DIAGNOSIS — R279 Unspecified lack of coordination: Secondary | ICD-10-CM | POA: Insufficient documentation

## 2016-07-15 DIAGNOSIS — I10 Essential (primary) hypertension: Secondary | ICD-10-CM | POA: Diagnosis not present

## 2016-07-15 DIAGNOSIS — G8929 Other chronic pain: Secondary | ICD-10-CM | POA: Diagnosis not present

## 2016-07-15 DIAGNOSIS — M545 Low back pain: Secondary | ICD-10-CM | POA: Diagnosis not present

## 2016-07-15 MED FILL — HYDROCHLOROTHIAZIDE 25 MG T: 25 | 30 days supply | Qty: 30 | Fill #0

## 2016-07-15 MED FILL — METOPROLOL SUCC ER 25 MG TA: 25 | 90 days supply | Qty: 90 | Fill #0

## 2016-07-15 NOTE — Therapy (Signed)
[facility] [address], [place], [place], [address] Phone: [phone]   Fax:  [phone]  Physical Therapy Evaluation  Patient Details  Name: Alicia Hogan MRN: [id number] Date of Birth: April 10, 1950 Referring Provider: Kristeen Miss   Encounter Date: 07/15/2016      PT End of Session - 07/15/16 1159    Visit Number 1   Date for PT Re-Evaluation 09/09/16   PT Start Time 1106   PT Stop Time 1147   PT Time Calculation (min) 41 min   Activity Tolerance Patient tolerated treatment well   Behavior During Therapy [facility] for tasks assessed/performed      Past Medical History:  Diagnosis Date  . Arthritis   . COPD (chronic obstructive pulmonary disease) ([place])   . Dysrhythmia    RBB  . GERD (gastroesophageal reflux disease)   . Hypertension   . Plantar fasciitis 12/28/2013    Past Surgical History:  Procedure Laterality Date  . BACK SURGERY  11/2015   lumbar foraminotomy  . COLONOSCOPY    . MENISCUS REPAIR Left 2012  . TONSILLECTOMY     and adenoidectomy    There were no vitals filed for this visit.       Subjective Assessment - 07/15/16 1109    Subjective I have been doing well from the surgery and not having sciatica, but  I have had numbness and burning down the sides of my legs.  It feels like it has gotten worse.  It gets worse by the end of the day.  Neurontin seems to help.  Walking 1 mile/day currently.     Limitations House hold activities   Patient Stated Goals strengthening    Currently in Pain? Yes   Pain Score 1    Pain Location Back   Pain Orientation Lower   Pain Descriptors / Indicators Aching;Tightness   Pain Type Chronic pain   Pain Onset More than a month ago   Pain Frequency Intermittent   Aggravating Factors  gets worse throughout the day   Pain Relieving Factors medicine, rest   Effect of Pain on Daily Activities not able to lift or bend            [facility] PT Assessment -  07/15/16 0001      Assessment   Medical Diagnosis M54.16 (ICD-10-CM) - Radiculopathy, lumbar region   Referring Provider Kristeen Miss    Onset Date/Surgical Date 04/08/16   Prior Therapy yes     Precautions   Precautions Back   Precaution Comments MD said pt can do up to 30 lb lifting and begin bending     Restrictions   Weight Bearing Restrictions No     Balance Screen   Has the patient fallen in the past 6 months No     [place] residence   Living Arrangements Spouse/significant other     Prior Function   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 lifting and 12 hour shifts   Leisure gardening, ballroom dancing     Cognition   Overall Cognitive Status Within Functional Limits for tasks assessed     Observation/Other Assessments   Focus on Therapeutic Outcomes (FOTO)  43% limited     Posture/Postural Control   Posture/Postural Control Postural limitations   Postural Limitations Rounded Shoulders;Decreased lumbar lordosis;Anterior pelvic tilt     ROM / Strength  AROM / PROM / Strength AROM;PROM;Strength     AROM   Overall AROM Comments lumbar flexion 50% limited     PROM   Overall PROM Comments hip internal and external rotation limited right >left     Strength   Overall Strength Comments core 4/5 MMT   Strength Assessment Site Hip   Right/Left Hip Right;Left   Right Hip Flexion 4-/5   Right Hip Extension 4/5   Right Hip External Rotation  4-/5   Right Hip ABduction 4/5   Right Hip ADduction 4-/5   Left Hip Flexion 4-/5   Left Hip Extension 4/5   Left Hip External Rotation 4-/5   Left Hip ABduction 4/5   Left Hip ADduction 4-/5     Flexibility   Soft Tissue Assessment /Muscle Length yes   Quadriceps limited     Ambulation/Gait   Gait Pattern Decreased step length - right;Decreased step length - left            Objective  measurements completed on examination: See above findings.          [facility] Adult PT Treatment/Exercise - 07/15/16 0001      Neuro Re-ed    Neuro Re-ed Details  balloon breathing     Exercises   Exercises Other Exercises   Other Exercises  hip stretches in HEP                PT Education - 07/15/16 1159    Education provided Yes   Education Details hip stretches, balloon breathing   Person(s) Educated Patient   Methods Explanation;Demonstration;Tactile cues;Verbal cues;Handout   Comprehension Verbalized understanding;Returned demonstration          PT Short Term Goals - 07/15/16 2122      PT SHORT TERM GOAL #1   Title indepedent with initial HEP   Time 2   Period Weeks   Status New     PT SHORT TERM GOAL #2   Title ability to demonstrate correct body mechanics with lifting tasks   Time 4   Period Weeks   Status New           PT Long Term Goals - 07/15/16 2123      PT LONG TERM GOAL #1   Title Independent with HEP   Time 8   Period Weeks   Status New     PT LONG TERM GOAL #2   Title return to ballroom dancing due to improved core strength and muscle coordination   Time 8   Period Weeks   Status New     PT LONG TERM GOAL #3   Title work in garden without increased pain due to improved mobility and decreased muscle tightness   Time 8   Period Weeks   Status New     PT LONG TERM GOAL #4   Title FOTO </= 39% limitation   Time 8   Period Weeks   Status New     PT LONG TERM GOAL #5   Title able to walk >2 miles without increased pain or numbness in LE   Time 8   Period Weeks   Status New                Plan - 07/15/16 2126    Clinical Impression Statement Patient has undergone surgery for lumbar fusion in February of this year.  She has improved and is healing well from surgery, but is currently experiencing increased hip tightness and pain down  the side of her LE bilaterally.  She has lmited lumbar ROM, difficulty walking far  distances, and muscle spasms in low back and glutes.  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  Pt hopes to return to these activities as well.  Pt has decreased muscle coordination of abdominals during functional movements such as bed mobility and transfers.  Pt demonstrates weaknessof coe and bilateral hips.  Postural deficits include decreased lumbar lordosis and rounded shoulers. Pt is experiencing pain and numbness down bilateral hip and thigh region that has been getting worse recently.  Pt also has scar and fasdcial adhesions around incision with some tenderness to palpation in that region.  Pt will benefit form skilled PT to address these impairments and facilitate patient in returning to functional activities and work if possible.   History and Personal Factors relevant to plan of care: chronic pain, back surgery   Clinical Presentation Stable   Clinical Presentation due to: pt is stable    Clinical Decision Making Moderate   Rehab Potential Good   Clinical Impairments Affecting Rehab Potential chronic pain and history of back surgery   PT Frequency 2x / week   PT Duration 8 weeks   PT Treatment/Interventions ADLs/Self Care Home Management;Biofeedback;Cryotherapy;Electrical Stimulation;Iontophoresis 4mg /ml Dexamethasone;Moist Heat;Traction;Ultrasound;Gait training;Stair training;Functional mobility training;Therapeutic activities;Therapeutic exercise;Balance training;Neuromuscular re-education;Patient/family education;Manual techniques;Scar mobilization;Passive range of motion;Dry needling;Taping   PT Next Visit Plan hip and lumbar stretches and ROM, gentle rotation and core strengthening, manual and modalities as needed   Recommended Other Services n/a   Consulted and Agree with Plan of Care Patient      Patient will benefit from skilled therapeutic intervention in order to improve the following deficits and  impairments:  Decreased activity tolerance, Decreased endurance, Decreased coordination, Decreased range of motion, Difficulty walking, Decreased strength, Increased fascial restricitons, Increased muscle spasms, Pain, Postural dysfunction  Visit Diagnosis: Chronic midline low back pain without sciatica  Muscle weakness (generalized)  Unspecified lack of coordination     Problem List Patient Active Problem List   Diagnosis Date Noted  . Lumbar radiculopathy, chronic 04/08/2016  . Chest discomfort 06/13/2014  . LBBB (left bundle branch block) 06/13/2014  . 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 Dyslipidemia 06/13/2014  . Hypertension   . Plantar fasciitis, bilateral 08/03/2012  . Equinus deformity of foot, acquired 08/03/2012  . Metatarsal deformity 08/03/2012    Zannie Cove, PT 07/15/2016, 9:53 PM  [facility] [address] [place], [place], [address] Phone: [phone]   Fax:  [phone]  Name: Alicia Hogan MRN: [id number] Date of Birth: June 02, 1950

## 2016-07-15 NOTE — Patient Instructions (Signed)
   Drop knee down towards the mat until you feel a stretch Hold 30 sec do 3 x each side    Hold 10 sec and repeat 5 times  Balloon Breath    Place hands LIGHTLY on belly below navel. Imagine a balloon inside belly. Blow up balloon on breath IN, deflate balloon on breath OUT. Contract abdominals slightly to assist breath OUT. Time _5__ minutes.  Copyright  VHI. All rights reserved.

## 2016-07-16 DIAGNOSIS — L821 Other seborrheic keratosis: Secondary | ICD-10-CM | POA: Diagnosis not present

## 2016-07-17 ENCOUNTER — Ambulatory Visit: Payer: 59 | Admitting: Physical Therapy

## 2016-07-17 DIAGNOSIS — R279 Unspecified lack of coordination: Secondary | ICD-10-CM | POA: Diagnosis not present

## 2016-07-17 DIAGNOSIS — M545 Low back pain, unspecified: Secondary | ICD-10-CM

## 2016-07-17 DIAGNOSIS — G8929 Other chronic pain: Secondary | ICD-10-CM

## 2016-07-17 DIAGNOSIS — M6281 Muscle weakness (generalized): Secondary | ICD-10-CM

## 2016-07-17 NOTE — Therapy (Signed)
[facility] [address] [place], [place], [address] Phone: [phone]   Fax:  [phone]  Physical Therapy Treatment  Patient Details  Name: Alicia Hogan MRN: [id number] Date of Birth: [birth year] Referring Provider: Kristeen Miss   Encounter Date: 07/17/2016      PT End of Session - 07/17/16 1155    Visit Number 2   Date for PT Re-Evaluation 09/09/16   PT Start Time 1148   PT Stop Time 1227   PT Time Calculation (min) 39 min   Activity Tolerance Patient tolerated treatment well   Behavior During Therapy [facility] for tasks assessed/performed      Past Medical History:  Diagnosis Date  . Arthritis   . COPD (chronic obstructive pulmonary disease) ([place])   . Dysrhythmia    RBB  . GERD (gastroesophageal reflux disease)   . Hypertension   . Plantar fasciitis 12/28/2013    Past Surgical History:  Procedure Laterality Date  . BACK SURGERY  11/2015   lumbar foraminotomy  . COLONOSCOPY    . MENISCUS REPAIR Left 2012  . TONSILLECTOMY     and adenoidectomy    There were no vitals filed for this visit.      Subjective Assessment - 07/17/16 1158    Subjective (P)  I have still been feeling the sides of my legs.  It's burning, tingling, numbness.     Limitations (P)  House hold activities   Patient Stated Goals (P)  strengthening    Currently in Pain? (P)  Yes   Pain Score (P)  1    Pain Location (P)  Back   Pain Orientation (P)  Lower   Pain Descriptors / Indicators (P)  Aching   Pain Type (P)  Chronic pain   Pain Radiating Towards (P)  down sides of leg   Pain Onset (P)  More than a month ago   Pain Frequency (P)  Intermittent   Aggravating Factors  (P)  standing and walking all day   Pain Relieving Factors (P)  neurontin, rest                         [facility] Adult PT Treatment/Exercise - 07/17/16 0001      Neuro Re-ed    Neuro Re-ed Details  breathing and facilitation of abdominal  muscle     Lumbar Exercises: Stretches   Piriformis Stretch 3 reps;30 seconds     Lumbar Exercises: Supine   Large Ball Oblique Isometric 20 reps  ball overhead     Manual Therapy   Manual Therapy Soft tissue mobilization;Myofascial release   Soft tissue mobilization bilat psoas, diaphragm   Myofascial Release incision on lumbar, abdominal MFR                  PT Short Term Goals - 07/15/16 2122      PT SHORT TERM GOAL #1   Title indepedent with initial HEP   Time 2   Period Weeks   Status New     PT SHORT TERM GOAL #2   Title ability to demonstrate correct body mechanics with lifting tasks   Time 4   Period Weeks   Status New           PT Long Term Goals - 07/15/16 2123      PT LONG TERM GOAL #1   Title Independent with HEP   Time 8   Period Weeks  Status New     PT LONG TERM GOAL #2   Title return to ballroom dancing due to improved core strength and muscle coordination   Time 8   Period Weeks   Status New     PT LONG TERM GOAL #3   Title work in garden without increased pain due to improved mobility and decreased muscle tightness   Time 8   Period Weeks   Status New     PT LONG TERM GOAL #4   Title FOTO </= 39% limitation   Time 8   Period Weeks   Status New     PT LONG TERM GOAL #5   Title able to walk >2 miles without increased pain or numbness in LE   Time 8   Period Weeks   Status New               Plan - 07/17/16 2112    Clinical Impression Statement Patient has not made progress toward goals due to first follow up treatment since eval.  Pt had fascial restrictions in hip flexors and diaphragm.  She will benefit from skilled PT for faciltating soft tissue length and returning core strength for improved functional activities   Rehab Potential Good   Clinical Impairments Affecting Rehab Potential chronic pain and history of back surgery   PT Treatment/Interventions ADLs/Self Care Home  Management;Biofeedback;Cryotherapy;Electrical Stimulation;Iontophoresis 4mg /ml Dexamethasone;Moist Heat;Traction;Ultrasound;Gait training;Stair training;Functional mobility training;Therapeutic activities;Therapeutic exercise;Balance training;Neuromuscular re-education;Patient/family education;Manual techniques;Scar mobilization;Passive range of motion;Dry needling;Taping   PT Next Visit Plan f/u on response to psoas release, progress core strength and ROM, progress to quadruped, manual and modalities as needed   Consulted and Agree with Plan of Care Patient      Patient will benefit from skilled therapeutic intervention in order to improve the following deficits and impairments:  Decreased activity tolerance, Decreased endurance, Decreased coordination, Decreased range of motion, Difficulty walking, Decreased strength, Increased fascial restricitons, Increased muscle spasms, Pain, Postural dysfunction  Visit Diagnosis: Chronic midline low back pain without sciatica  Muscle weakness (generalized)  Unspecified lack of coordination     Problem List Patient Active Problem List   Diagnosis Date Noted  . Lumbar radiculopathy, chronic 04/08/2016  . Chest discomfort 06/13/2014  . LBBB (left bundle branch block) 06/13/2014  . 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 Dyslipidemia 06/13/2014  . Hypertension   . Plantar fasciitis, bilateral 08/03/2012  . Equinus deformity of foot, acquired 08/03/2012  . Metatarsal deformity 08/03/2012    Zannie Cove, PT 07/17/2016, 9:16 PM  [facility] [address] [place], [place], [address] Phone: [phone]   Fax:  [phone]  Name: Alicia Hogan MRN: [id number] Date of Birth: 17-Jan-1951

## 2016-07-21 ENCOUNTER — Ambulatory Visit: Payer: 59 | Admitting: Physical Therapy

## 2016-07-21 DIAGNOSIS — M545 Low back pain, unspecified: Secondary | ICD-10-CM

## 2016-07-21 DIAGNOSIS — M6281 Muscle weakness (generalized): Secondary | ICD-10-CM

## 2016-07-21 DIAGNOSIS — G8929 Other chronic pain: Secondary | ICD-10-CM | POA: Diagnosis not present

## 2016-07-21 DIAGNOSIS — R279 Unspecified lack of coordination: Secondary | ICD-10-CM

## 2016-07-21 MED FILL — MELOXICAM 15 MG TABLET: 15 | 30 days supply | Qty: 30 | Fill #3

## 2016-07-21 NOTE — Patient Instructions (Signed)
   QUADRUPED ALTERNATE ARM   While in a crawling position, slowly raise up an arm out in front of you. Brace the abdominals as you reach your arm forward  Repeat 10x each side    Transverse Abdominus Activation  Contract your lower abdominals as if you were trying to lift one leg from the table.   Initiate the movement but do no lift foot greater than 1 inch from the table.  Repeat opposite side. 20x each side holding 2 sec each side

## 2016-07-21 NOTE — Therapy (Signed)
[facility] [address], [place], [place], [address] Phone: [phone]   Fax:  [phone]  Physical Therapy Treatment  Patient Details  Name: Alicia Hogan MRN: [id number] Date of Birth: 01-03-1951 Referring Provider: Kristeen Miss   Encounter Date: 07/21/2016      PT End of Session - 07/21/16 1418    Visit Number 3   Date for PT Re-Evaluation 09/09/16   PT Start Time 2500   PT Stop Time 1447   PT Time Calculation (min) 40 min   Activity Tolerance Patient tolerated treatment well   Behavior During Therapy [facility] for tasks assessed/performed      Past Medical History:  Diagnosis Date  . Arthritis   . COPD (chronic obstructive pulmonary disease) ([place])   . Dysrhythmia    RBB  . GERD (gastroesophageal reflux disease)   . Hypertension   . Plantar fasciitis 12/28/2013    Past Surgical History:  Procedure Laterality Date  . BACK SURGERY  11/2015   lumbar foraminotomy  . COLONOSCOPY    . MENISCUS REPAIR Left 2012  . TONSILLECTOMY     and adenoidectomy    There were no vitals filed for this visit.      Subjective Assessment - 07/21/16 1410    Subjective I have been getting more cramps in my legs.  The hip soft tissue mobs helped last session, it was easier to walk up the hill when I walk the dog.   Limitations House hold activities   Patient Stated Goals strengthening    Currently in Pain? No/denies                         [facility] Adult PT Treatment/Exercise - 07/21/16 0001      Lumbar Exercises: Aerobic   Stationary Bike Nustep - 5 min     Lumbar Exercises: Seated   Sit to Stand 10 reps  yellow band 10x; ball squeeze 10 x     Lumbar Exercises: Supine   Ab Set 20 reps  pelvic tilt with ball squeeze   Bridge --   Large Ball Oblique Isometric 20 reps  ball overhead and roll outs   Other Supine Lumbar Exercises hip abduction with core contraction                  PT  Short Term Goals - 07/21/16 1413      PT SHORT TERM GOAL #1   Title indepedent with initial HEP   Time 2   Period Weeks   Status Achieved           PT Long Term Goals - 07/21/16 1413      PT LONG TERM GOAL #1   Title Independent with HEP   Period Weeks   Status On-going     PT LONG TERM GOAL #2   Title return to ballroom dancing due to improved core strength and muscle coordination   Time 8   Period Weeks   Status On-going     PT LONG TERM GOAL #3   Title work in garden without increased pain due to improved mobility and decreased muscle tightness   Time 8   Period Weeks   Status On-going     PT LONG TERM GOAL #4   Title FOTO </= 39% limitation   Time 8   Period Weeks   Status On-going     PT LONG TERM GOAL #5   Title able  to walk >2 miles without increased pain or numbness in LE   Time 8   Period Weeks   Status On-going               Plan - 07/21/16 1414    Clinical Impression Statement Pt unable to perform bridge due to pain.  Did pelvic tilts and had a little pain when tilting too far.  Pt able to perform quadruped and find good abdominal contraction.     Clinical Impairments Affecting Rehab Potential chronic pain and history of back surgery   PT Treatment/Interventions ADLs/Self Care Home Management;Biofeedback;Cryotherapy;Electrical Stimulation;Iontophoresis 4mg /ml Dexamethasone;Moist Heat;Traction;Ultrasound;Gait training;Stair training;Functional mobility training;Therapeutic activities;Therapeutic exercise;Balance training;Neuromuscular re-education;Patient/family education;Manual techniques;Scar mobilization;Passive range of motion;Dry needling;Taping   PT Next Visit Plan progress core strength and lumbar and hip ROM   Consulted and Agree with Plan of Care Patient      Patient will benefit from skilled therapeutic intervention in order to improve the following deficits and impairments:  Decreased activity tolerance, Decreased endurance, Decreased  coordination, Decreased range of motion, Difficulty walking, Decreased strength, Increased fascial restricitons, Increased muscle spasms, Pain, Postural dysfunction  Visit Diagnosis: Chronic midline low back pain without sciatica  Muscle weakness (generalized)  Unspecified lack of coordination     Problem List Patient Active Problem List   Diagnosis Date Noted  . Lumbar radiculopathy, chronic 04/08/2016  . Chest discomfort 06/13/2014  . LBBB (left bundle branch block) 06/13/2014  . 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 Dyslipidemia 06/13/2014  . Hypertension   . Plantar fasciitis, bilateral 08/03/2012  . Equinus deformity of foot, acquired 08/03/2012  . Metatarsal deformity 08/03/2012    Zannie Cove, PT 07/21/2016, 3:48 PM  [place] [address], [place], [place], [address] Phone: [phone]   Fax:  [phone]  Name: Alicia Hogan MRN: [id number] Date of Birth: [birth year]

## 2016-07-23 ENCOUNTER — Encounter: Payer: Self-pay | Admitting: Physical Therapy

## 2016-07-23 ENCOUNTER — Ambulatory Visit: Payer: 59 | Admitting: Physical Therapy

## 2016-07-23 DIAGNOSIS — R279 Unspecified lack of coordination: Secondary | ICD-10-CM

## 2016-07-23 DIAGNOSIS — M6281 Muscle weakness (generalized): Secondary | ICD-10-CM

## 2016-07-23 DIAGNOSIS — M545 Low back pain, unspecified: Secondary | ICD-10-CM

## 2016-07-23 DIAGNOSIS — G8929 Other chronic pain: Secondary | ICD-10-CM

## 2016-07-23 NOTE — Therapy (Signed)
[facility] [address] [place], [place], [address] Phone: [phone]   Fax:  [phone]  Physical Therapy Treatment  Patient Details  Name: ALMINA SCHUL MRN: [id number] Date of Birth: 31-Jan-1951 Referring Provider: Kristeen Miss   Encounter Date: 07/23/2016      PT End of Session - 07/23/16 1256    Visit Number 4   Date for PT Re-Evaluation 09/09/16   PT Start Time 9326   PT Stop Time 1311   PT Time Calculation (min) 40 min   Activity Tolerance Patient tolerated treatment well   Behavior During Therapy [facility] for tasks assessed/performed      Past Medical History:  Diagnosis Date  . Arthritis   . COPD (chronic obstructive pulmonary disease) ([place])   . Dysrhythmia    RBB  . GERD (gastroesophageal reflux disease)   . Hypertension   . Plantar fasciitis 12/28/2013    Past Surgical History:  Procedure Laterality Date  . BACK SURGERY  11/2015   lumbar foraminotomy  . COLONOSCOPY    . MENISCUS REPAIR Left 2012  . TONSILLECTOMY     and adenoidectomy    There were no vitals filed for this visit.      Subjective Assessment - 07/23/16 1254    Subjective My quads are sore today, but I have been doing more walking.  [place] am leaving [place] on [place].   Limitations House hold activities   Patient Stated Goals strengthening    Currently in Pain? No/denies                         [facility] Adult PT Treatment/Exercise - 07/23/16 0001      Neuro Re-ed    Neuro Re-ed Details  coordinating abdominal bracing with all exercises     Lumbar Exercises: Stretches   Quadruped Mid Back Stretch 3 reps;20 seconds   Quad Stretch 20 seconds;1 rep  bilateral     Lumbar Exercises: Standing   Row Strengthening;Power tower;Both;20 reps  20 lb   Shoulder Extension Strengthening;Power Tower;Both;20 reps  20 lb   Other Standing Lumbar Exercises power tower - oblique punches - 15 lb - 20x each  way   Other Standing Lumbar Exercises walking with sports cord - 20lb - 4 wyas x 5 each     Lumbar Exercises: Supine   Bent Knee Raise 20 reps     Lumbar Exercises: Quadruped   Single Arm Raise 20 reps;2 seconds   Plank quad ped position with abdominal bracing - 10x 5 sec hold                PT Education - 07/23/16 1318    Education provided Yes   Education Details HEP   Methods Explanation;Tactile cues;Verbal cues;Handout;Demonstration   Comprehension Verbalized understanding;Returned demonstration          PT Short Term Goals - 07/21/16 1413      PT SHORT TERM GOAL #1   Title indepedent with initial HEP   Time 2   Period Weeks   Status Achieved           PT Long Term Goals - 07/21/16 1413      PT LONG TERM GOAL #1   Title Independent with HEP   Period Weeks   Status On-going     PT LONG TERM GOAL #2   Title return to ballroom dancing due to improved core strength and muscle coordination  Time 8   Period Weeks   Status On-going     PT LONG TERM GOAL #3   Title work in garden without increased pain due to improved mobility and decreased muscle tightness   Time 8   Period Weeks   Status On-going     PT LONG TERM GOAL #4   Title FOTO </= 39% limitation   Time 8   Period Weeks   Status On-going     PT LONG TERM GOAL #5   Title able to walk >2 miles without increased pain or numbness in LE   Time 8   Period Weeks   Status On-going               Plan - 07/23/16 1233    Clinical Impression Statement Pt able to progress strengthening program and added to HEP.  Pt had some increased buttock and hip pain with side stepping due to muscle tightness.  Pt had relief with piriformis stretch after performing exercises.  She continues to need skilled PT to improved core strength during funcitonal activities   Clinical Impairments Affecting Rehab Potential chronic pain and history of back surgery   PT Treatment/Interventions ADLs/Self Care Home  Management;Biofeedback;Cryotherapy;Electrical Stimulation;Iontophoresis 4mg /ml Dexamethasone;Moist Heat;Traction;Ultrasound;Gait training;Stair training;Functional mobility training;Therapeutic activities;Therapeutic exercise;Balance training;Neuromuscular re-education;Patient/family education;Manual techniques;Scar mobilization;Passive range of motion;Dry needling;Taping   PT Next Visit Plan progress core strength and lumbar and hip ROM   Consulted and Agree with Plan of Care Patient      Patient will benefit from skilled therapeutic intervention in order to improve the following deficits and impairments:  Decreased activity tolerance, Decreased endurance, Decreased coordination, Decreased range of motion, Difficulty walking, Decreased strength, Increased fascial restricitons, Increased muscle spasms, Pain, Postural dysfunction  Visit Diagnosis: Chronic midline low back pain without sciatica  Muscle weakness (generalized)  Unspecified lack of coordination     Problem List Patient Active Problem List   Diagnosis Date Noted  . Lumbar radiculopathy, chronic 04/08/2016  . Chest discomfort 06/13/2014  . LBBB (left bundle branch block) 06/13/2014  . 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 Dyslipidemia 06/13/2014  . Hypertension   . Plantar fasciitis, bilateral 08/03/2012  . Equinus deformity of foot, acquired 08/03/2012  . Metatarsal deformity 08/03/2012    Zannie Cove, PT 07/23/2016, 1:23 PM   [facility] [address] [place], [place], [address] Phone: [phone]   Fax:  [phone]  Name: SHERRE WOOTON MRN: [id number] Date of Birth: 1950-12-28

## 2016-07-23 NOTE — Patient Instructions (Signed)
   Oblique punch  Stand with the theraband or cable column to your side. Stand with your knees unlocked hips set backwards. Hold the band or pulley at your sternum/mid chest. Slowly push the band/pulley forward in a punch position. Slowly return to the starting position. Maintain a straight line in and out. Repeat 20x each side   Pull arms straight back, keeping shoulders down repeat 2 sets of 10     Can do with machine: Keep arms straight and contract abdominals and you bring arms down to your side Keep shoulders down and back: 2 sets of 10   Sit to stand with abdominal bracing 2 sets of 10     Transverse Abdominus Activation  Contract your lower abdominals as if you were trying to lift one leg from the table.  Initiate the movement but do no lift foot greater than 1 inch from the table.  Repeat opposite side.      Belly Breathing Transverse Abdominus Activation- Quadruped  On hands and knees with spine in neutral, take in a big breath allowing the belly to expand downward toward floor. As you exhale, feel the elastic recoil of your belly and follow it by drawing your belly up to your spine. Your spine should not move while performing.   Then contract and lift arm straight forward  Repeat 10x

## 2016-08-11 MED FILL — HYDROCHLOROTHIAZIDE 25 MG T: 25 | 30 days supply | Qty: 30 | Fill #1

## 2016-08-12 ENCOUNTER — Ambulatory Visit: Payer: 59 | Attending: Neurological Surgery | Admitting: Physical Therapy

## 2016-08-12 DIAGNOSIS — R279 Unspecified lack of coordination: Secondary | ICD-10-CM | POA: Diagnosis not present

## 2016-08-12 DIAGNOSIS — M545 Low back pain: Secondary | ICD-10-CM | POA: Diagnosis not present

## 2016-08-12 DIAGNOSIS — G8929 Other chronic pain: Secondary | ICD-10-CM | POA: Insufficient documentation

## 2016-08-12 DIAGNOSIS — M6281 Muscle weakness (generalized): Secondary | ICD-10-CM | POA: Insufficient documentation

## 2016-08-12 DIAGNOSIS — Z1231 Encounter for screening mammogram for malignant neoplasm of breast: Secondary | ICD-10-CM | POA: Diagnosis not present

## 2016-08-12 NOTE — Therapy (Signed)
[facility] [address], [place], [place], [address] Phone: [phone]   Fax:  [phone]  Physical Therapy Treatment  Patient Details  Name: Alicia Hogan MRN: [id number] Date of Birth: 1950/08/24 Referring Provider: Kristeen Miss   Encounter Date: 08/12/2016      PT End of Session - 08/12/16 1243    Visit Number 5   Date for PT Re-Evaluation 09/09/16   PT Start Time 1237   PT Stop Time 1317   PT Time Calculation (min) 40 min   Activity Tolerance Patient tolerated treatment well   Behavior During Therapy [facility] for tasks assessed/performed      Past Medical History:  Diagnosis Date  . Arthritis   . COPD (chronic obstructive pulmonary disease) ([place])   . Dysrhythmia    RBB  . GERD (gastroesophageal reflux disease)   . Hypertension   . Plantar fasciitis 12/28/2013    Past Surgical History:  Procedure Laterality Date  . BACK SURGERY  11/2015   lumbar foraminotomy  . COLONOSCOPY    . MENISCUS REPAIR Left 2012  . TONSILLECTOMY     and adenoidectomy    There were no vitals filed for this visit.      Subjective Assessment - 08/12/16 1241    Subjective I fell on my chest because I got pushed over a tree root.  I walked about 2-4 miles.  I have a CAT scan on the 5th.  I had very sharp jabs when walking on uphills and that was new.     Limitations House hold activities   Patient Stated Goals strengthening    Currently in Pain? Yes   Pain Score 2    Pain Location Back   Pain Orientation Lower   Pain Descriptors / Indicators Aching   Pain Type Chronic pain   Pain Onset More than a month ago   Aggravating Factors  gets worse throughout the day   Pain Relieving Factors medicine and rest   Multiple Pain Sites No                         [facility] Adult PT Treatment/Exercise - 08/12/16 0001      Lumbar Exercises: Stretches   Piriformis Stretch 3 reps;20 seconds  piriformis, hip flexor  stretch 30 sec     Lumbar Exercises: Machines for Strengthening   Leg Press bilateral 80#; single 40# - 2x10     Lumbar Exercises: Standing   Functional Squats 10 reps  cues for alignment     Lumbar Exercises: Seated   Hip Flexion on Ball AAROM;5 reps  rolling blue ball for lumbar flexion stretch     Knee/Hip Exercises: Standing   Forward Step Up 15 reps;Right;Left;Hand Hold: 1  on BOSU pause at the top   SLS hip flexion with pressing ball against the wall - 3x 5 each side                  PT Short Term Goals - 08/12/16 1314      PT SHORT TERM GOAL #2   Title ability to demonstrate correct body mechanics with lifting tasks   Time 4   Period Weeks   Status On-going           PT Long Term Goals - 08/12/16 1315      PT LONG TERM GOAL #1   Title Independent with HEP   Time 8   Period Weeks  Status On-going     PT LONG TERM GOAL #2   Title return to ballroom dancing due to improved core strength and muscle coordination   Time 8   Period Weeks   Status On-going     PT LONG TERM GOAL #3   Title work in garden without increased pain due to improved mobility and decreased muscle tightness   Time 8   Period Weeks   Status On-going     PT LONG TERM GOAL #4   Title FOTO </= 39% limitation   Period Weeks   Status On-going     PT LONG TERM GOAL #5   Title able to walk >2 miles without increased pain or numbness in LE   Baseline walking up the hills caused    Period Weeks   Status On-going               Plan - 08/12/16 1353    Clinical Impression Statement Patient had some increased low back tightness after exercises.  Pt continues to need cues and has weak core and hips.  She will benefit from skilled PT to address core strength for functional activities.   Rehab Potential Good   Clinical Impairments Affecting Rehab Potential chronic pain and history of back surgery   PT Treatment/Interventions ADLs/Self Care Home  Management;Biofeedback;Cryotherapy;Electrical Stimulation;Iontophoresis 4mg /ml Dexamethasone;Moist Heat;Traction;Ultrasound;Gait training;Stair training;Functional mobility training;Therapeutic activities;Therapeutic exercise;Balance training;Neuromuscular re-education;Patient/family education;Manual techniques;Scar mobilization;Passive range of motion;Dry needling;Taping   PT Next Visit Plan progress core strength and lumbar and hip ROM   Consulted and Agree with Plan of Care Patient      Patient will benefit from skilled therapeutic intervention in order to improve the following deficits and impairments:  Decreased activity tolerance, Decreased endurance, Decreased coordination, Decreased range of motion, Difficulty walking, Decreased strength, Increased fascial restricitons, Increased muscle spasms, Pain, Postural dysfunction  Visit Diagnosis: Chronic midline low back pain without sciatica  Muscle weakness (generalized)  Unspecified lack of coordination     Problem List Patient Active Problem List   Diagnosis Date Noted  . Lumbar radiculopathy, chronic 04/08/2016  . Chest discomfort 06/13/2014  . LBBB (left bundle branch block) 06/13/2014  . 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 Dyslipidemia 06/13/2014  . Hypertension   . Plantar fasciitis, bilateral 08/03/2012  . Equinus deformity of foot, acquired 08/03/2012  . Metatarsal deformity 08/03/2012    Wayne Both 08/12/2016, 1:56 PM  [place] [facility] [address], [place], [place], [address] Phone: [phone]   Fax:  [phone]  Name: Alicia Hogan MRN: [id number] Date of Birth: Mar 19, 1950

## 2016-08-14 ENCOUNTER — Ambulatory Visit (HOSPITAL_COMMUNITY)
Admission: RE | Admit: 2016-08-14 | Discharge: 2016-08-14 | Disposition: A | Discharge status: Home / Self Care | Payer: 59 | Source: Ambulatory Visit | Attending: Neurological Surgery | Admitting: Neurological Surgery

## 2016-08-14 DIAGNOSIS — M545 Low back pain, unspecified: Secondary | ICD-10-CM

## 2016-08-14 DIAGNOSIS — Z981 Arthrodesis status: Secondary | ICD-10-CM | POA: Diagnosis not present

## 2016-08-14 DIAGNOSIS — M5416 Radiculopathy, lumbar region: Secondary | ICD-10-CM | POA: Diagnosis not present

## 2016-08-15 ENCOUNTER — Encounter: Payer: Self-pay | Admitting: Physical Therapy

## 2016-08-15 ENCOUNTER — Ambulatory Visit: Payer: 59 | Admitting: Physical Therapy

## 2016-08-15 DIAGNOSIS — R279 Unspecified lack of coordination: Secondary | ICD-10-CM

## 2016-08-15 DIAGNOSIS — M545 Low back pain, unspecified: Secondary | ICD-10-CM

## 2016-08-15 DIAGNOSIS — Z1231 Encounter for screening mammogram for malignant neoplasm of breast: Secondary | ICD-10-CM | POA: Diagnosis not present

## 2016-08-15 DIAGNOSIS — M6281 Muscle weakness (generalized): Secondary | ICD-10-CM | POA: Diagnosis not present

## 2016-08-15 DIAGNOSIS — G8929 Other chronic pain: Secondary | ICD-10-CM | POA: Diagnosis not present

## 2016-08-15 NOTE — Therapy (Signed)
[facility] [address], [place], [place], [address] Phone: [phone]   Fax:  [phone]  Physical Therapy Treatment  Patient Details  Name: Alicia Hogan MRN: [id number] Date of Birth: [birth year] Referring Provider: Kristeen Miss   Encounter Date: 08/15/2016      PT End of Session - 08/15/16 0935    Visit Number 6   Date for PT Re-Evaluation 09/09/16   PT Start Time 0935   PT Stop Time 1015   PT Time Calculation (min) 40 min   Activity Tolerance Patient tolerated treatment well   Behavior During Therapy [facility] for tasks assessed/performed      Past Medical History:  Diagnosis Date  . Arthritis   . COPD (chronic obstructive pulmonary disease) ([place])   . Dysrhythmia    RBB  . GERD (gastroesophageal reflux disease)   . Hypertension   . Plantar fasciitis 12/28/2013    Past Surgical History:  Procedure Laterality Date  . BACK SURGERY  11/2015   lumbar foraminotomy  . COLONOSCOPY    . MENISCUS REPAIR Left 2012  . TONSILLECTOMY     and adenoidectomy    There were no vitals filed for this visit.      Subjective Assessment - 08/15/16 0946    Subjective Pt had CT yesterday.  She walked 3 miles yesterday and was having more pain.  Pt CT scan reports partial fusion and everything in place with mod/severe foramen narrowing Left L5/S1.   Limitations House hold activities   Patient Stated Goals strengthening    Currently in Pain? No/denies                         [facility] Adult PT Treatment/Exercise - 08/15/16 0001      Lumbar Exercises: Stretches   Active Hamstring Stretch 3 reps;20 seconds   ITB Stretch 3 reps;30 seconds   Piriformis Stretch 3 reps;20 seconds  piriformis, hip flexor stretch 30 sec     Lumbar Exercises: Aerobic   Elliptical L1 3x3 fwd/back     Lumbar Exercises: Machines for Strengthening   Leg Press --     Manual Therapy   Manual Therapy Joint mobilization;Soft  tissue mobilization   Joint Mobilization long axis distraction bilat hip; AP mobs flexion and IR   Soft tissue mobilization bilat psoas                  PT Short Term Goals - 08/12/16 1314      PT SHORT TERM GOAL #2   Title ability to demonstrate correct body mechanics with lifting tasks   Time 4   Period Weeks   Status On-going           PT Long Term Goals - 08/12/16 1315      PT LONG TERM GOAL #1   Title Independent with HEP   Time 8   Period Weeks   Status On-going     PT LONG TERM GOAL #2   Title return to ballroom dancing due to improved core strength and muscle coordination   Time 8   Period Weeks   Status On-going     PT LONG TERM GOAL #3   Title work in garden without increased pain due to improved mobility and decreased muscle tightness   Time 8   Period Weeks   Status On-going     PT LONG TERM GOAL #4   Title FOTO </= 39% limitation  Period Weeks   Status On-going     PT LONG TERM GOAL #5   Title able to walk >2 miles without increased pain or numbness in LE   Baseline walking up the hills caused    Period Weeks   Status On-going               Plan - 08/15/16 0935    Clinical Impression Statement Patient had good response with hip mobs and soft tissue due to psoas tightness.  She continues to have bilateral piriformis and IT band tightness and will continue to benefit from skilled PT for soft tissue length and mobility to improve overall muscle function and reduce pain.   Clinical Impairments Affecting Rehab Potential chronic pain and history of back surgery   PT Treatment/Interventions ADLs/Self Care Home Management;Biofeedback;Cryotherapy;Electrical Stimulation;Iontophoresis 4mg /ml Dexamethasone;Moist Heat;Traction;Ultrasound;Gait training;Stair training;Functional mobility training;Therapeutic activities;Therapeutic exercise;Balance training;Neuromuscular re-education;Patient/family education;Manual techniques;Scar  mobilization;Passive range of motion;Dry needling;Taping   PT Next Visit Plan progress core strength and lumbar and hip ROM   Consulted and Agree with Plan of Care Patient      Patient will benefit from skilled therapeutic intervention in order to improve the following deficits and impairments:  Decreased activity tolerance, Decreased endurance, Decreased coordination, Decreased range of motion, Difficulty walking, Decreased strength, Increased fascial restricitons, Increased muscle spasms, Pain, Postural dysfunction  Visit Diagnosis: Chronic midline low back pain without sciatica  Muscle weakness (generalized)  Unspecified lack of coordination     Problem List Patient Active Problem List   Diagnosis Date Noted  . Lumbar radiculopathy, chronic 04/08/2016  . Chest discomfort 06/13/2014  . LBBB (left bundle branch block) 06/13/2014  . 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 Dyslipidemia 06/13/2014  . Hypertension   . Plantar fasciitis, bilateral 08/03/2012  . Equinus deformity of foot, acquired 08/03/2012  . Metatarsal deformity 08/03/2012    Zannie Cove, PT 08/15/2016, 11:36 AM  [facility] [address] [place], [place], [address] Phone: [phone]   Fax:  [phone]  Name: Alicia Hogan MRN: [id number] Date of Birth: 08/15/1950

## 2016-08-18 ENCOUNTER — Encounter: Payer: Self-pay | Admitting: Physical Therapy

## 2016-08-18 ENCOUNTER — Ambulatory Visit: Payer: 59 | Admitting: Physical Therapy

## 2016-08-18 DIAGNOSIS — R279 Unspecified lack of coordination: Secondary | ICD-10-CM

## 2016-08-18 DIAGNOSIS — G8929 Other chronic pain: Secondary | ICD-10-CM | POA: Diagnosis not present

## 2016-08-18 DIAGNOSIS — M545 Low back pain, unspecified: Secondary | ICD-10-CM

## 2016-08-18 DIAGNOSIS — M6281 Muscle weakness (generalized): Secondary | ICD-10-CM | POA: Diagnosis not present

## 2016-08-18 DIAGNOSIS — Z1231 Encounter for screening mammogram for malignant neoplasm of breast: Secondary | ICD-10-CM | POA: Diagnosis not present

## 2016-08-18 NOTE — Therapy (Signed)
[facility] [address], [place], [place], [address] Phone: [phone]   Fax:  [phone]  Physical Therapy Treatment  Patient Details  Name: Alicia Hogan MRN: [id number] Date of Birth: 08/14/1950 Referring Provider: Kristeen Miss   Encounter Date: 08/18/2016      PT End of Session - 08/18/16 1402    Visit Number 7   Date for PT Re-Evaluation 09/09/16   PT Start Time 4970   PT Stop Time 1444   PT Time Calculation (min) 42 min   Activity Tolerance Patient tolerated treatment well   Behavior During Therapy [facility] for tasks assessed/performed      Past Medical History:  Diagnosis Date  . Arthritis   . COPD (chronic obstructive pulmonary disease) ([place])   . Dysrhythmia    RBB  . GERD (gastroesophageal reflux disease)   . Hypertension   . Plantar fasciitis 12/28/2013    Past Surgical History:  Procedure Laterality Date  . BACK SURGERY  11/2015   lumbar foraminotomy  . COLONOSCOPY    . MENISCUS REPAIR Left 2012  . TONSILLECTOMY     and adenoidectomy    There were no vitals filed for this visit.      Subjective Assessment - 08/18/16 1407    Subjective Patient states she goes to see the doctor on Wednesday and will disucss return to work.  Pt does not feel ready to return to work.   Limitations House hold activities   Patient Stated Goals strengthening    Currently in Pain? Yes   Pain Score 3    Pain Location Back   Pain Descriptors / Indicators Aching   Pain Type Chronic pain   Pain Onset More than a month ago   Pain Frequency Intermittent   Aggravating Factors  lifting and gardening   Pain Relieving Factors medicine, rest, stretching   Effect of Pain on Daily Activities lift or bend   Multiple Pain Sites No                         [facility] Adult PT Treatment/Exercise - 08/18/16 0001      Lumbar Exercises: Stretches   Active Hamstring Stretch 3 reps;20 seconds   ITB Stretch 3  reps;30 seconds   Piriformis Stretch 3 reps;20 seconds  piriformis, hip flexor stretch 30 sec     Lumbar Exercises: Machines for Strengthening   Leg Press bilateral 100#; single 50# - 2x15     Lumbar Exercises: Standing   Functional Squats 10 reps  cues for alignment   Forward Lunge 10 reps  quarter lunge both sides   Other Standing Lumbar Exercises ball roll on mat (modified plank)- 10x 10 sec hold   Other Standing Lumbar Exercises standing wall push - red ball - 20x     Lumbar Exercises: Seated   Hip Flexion on Ball --  rolling blue ball for lumbar flexion stretch     Manual Therapy   Manual Therapy Myofascial release   Myofascial Release rolling spiky ball on glutes, hamstrings                  PT Short Term Goals - 08/18/16 1732      PT SHORT TERM GOAL #2   Title ability to demonstrate correct body mechanics with lifting tasks   Time 4   Period Weeks   Status On-going           PT Long Term  Goals - 08/18/16 1732      PT LONG TERM GOAL #1   Title Independent with HEP   Time 8   Period Weeks   Status On-going     PT LONG TERM GOAL #2   Title return to ballroom dancing due to improved core strength and muscle coordination   Time 8   Period Weeks   Status On-going               Plan - 08/18/16 1421    Clinical Impression Statement Patient continues to have tightness in hamstrings, glutes, IT band.  PT did rolling for myofascial release which was painful due to trigger points along bilateral LE.  Pt is progressing with LE and core strength and able to do 10 lunges on each side as well as increased weight on leg press. Pt continues to benefit from skilled PT to improve strength for bending and lifting to return to work and gardening safely   PT Treatment/Interventions ADLs/Self Care Home Management;Biofeedback;Cryotherapy;Electrical Stimulation;Iontophoresis 4mg /ml Dexamethasone;Moist Heat;Traction;Ultrasound;Gait training;Stair  training;Functional mobility training;Therapeutic activities;Therapeutic exercise;Balance training;Neuromuscular re-education;Patient/family education;Manual techniques;Scar mobilization;Passive range of motion;Dry needling;Taping   PT Next Visit Plan progress core and hip strength and lumbar and hip ROM, single leg exercises for dancing, lifting technique   Consulted and Agree with Plan of Care Patient      Patient will benefit from skilled therapeutic intervention in order to improve the following deficits and impairments:  Decreased activity tolerance, Decreased endurance, Decreased coordination, Decreased range of motion, Difficulty walking, Decreased strength, Increased fascial restricitons, Increased muscle spasms, Pain, Postural dysfunction  Visit Diagnosis: Chronic midline low back pain without sciatica  Muscle weakness (generalized)  Unspecified lack of coordination     Problem List Patient Active Problem List   Diagnosis Date Noted  . Lumbar radiculopathy, chronic 04/08/2016  . Chest discomfort 06/13/2014  . LBBB (left bundle branch block) 06/13/2014  . 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 Dyslipidemia 06/13/2014  . Hypertension   . Plantar fasciitis, bilateral 08/03/2012  . Equinus deformity of foot, acquired 08/03/2012  . Metatarsal deformity 08/03/2012    Zannie Cove, PT 08/18/2016, 5:33 PM  [facility] [address] [place], [place], [address] Phone: [phone]   Fax:  [phone]  Name: Alicia Hogan MRN: [id number] Date of Birth: Aug 17, 1950

## 2016-08-20 DIAGNOSIS — M5416 Radiculopathy, lumbar region: Secondary | ICD-10-CM | POA: Diagnosis not present

## 2016-08-20 MED FILL — MELOXICAM 15 MG TABLET: 15 | 30 days supply | Qty: 30 | Fill #4

## 2016-08-20 MED FILL — LOVASTATIN 40 MG TABLET: 40 | 90 days supply | Qty: 45 | Fill #2

## 2016-08-21 ENCOUNTER — Ambulatory Visit: Payer: 59 | Admitting: Physical Therapy

## 2016-08-21 DIAGNOSIS — G8929 Other chronic pain: Secondary | ICD-10-CM

## 2016-08-21 DIAGNOSIS — R279 Unspecified lack of coordination: Secondary | ICD-10-CM

## 2016-08-21 DIAGNOSIS — M545 Low back pain: Principal | ICD-10-CM

## 2016-08-21 DIAGNOSIS — M6281 Muscle weakness (generalized): Secondary | ICD-10-CM

## 2016-08-21 DIAGNOSIS — Z1231 Encounter for screening mammogram for malignant neoplasm of breast: Secondary | ICD-10-CM | POA: Diagnosis not present

## 2016-08-21 NOTE — Therapy (Signed)
[facility] [address] [place], [place], [address] Phone: [phone]   Fax:  [phone]  Physical Therapy Treatment  Patient Details  Name: Alicia Hogan MRN: [id number] Date of Birth: 02-05-1951 Referring Provider: Kristeen Miss   Encounter Date: 08/21/2016      PT End of Session - 08/21/16 1235    Visit Number 8   Date for PT Re-Evaluation 09/09/16   PT Start Time 0300   PT Stop Time 1312   PT Time Calculation (min) 41 min   Activity Tolerance Patient tolerated treatment well   Behavior During Therapy [facility] for tasks assessed/performed      Past Medical History:  Diagnosis Date  . Arthritis   . COPD (chronic obstructive pulmonary disease) ([place])   . Dysrhythmia    RBB  . GERD (gastroesophageal reflux disease)   . Hypertension   . Plantar fasciitis 12/28/2013    Past Surgical History:  Procedure Laterality Date  . BACK SURGERY  11/2015   lumbar foraminotomy  . COLONOSCOPY    . MENISCUS REPAIR Left 2012  . TONSILLECTOMY     and adenoidectomy    There were no vitals filed for this visit.      Subjective Assessment - 08/21/16 1237    Subjective Patient walked 3 miles this morning.  She states the doctor told her she can't work for 8 more weeks.  She states he said the bone didn't fuse as much as he would like yet.   Limitations House hold activities   Patient Stated Goals strengthening    Currently in Pain? Yes   Pain Score 3    Pain Location Back   Pain Orientation Lower   Pain Descriptors / Indicators Aching   Pain Type Chronic pain   Pain Onset More than a month ago   Pain Frequency Intermittent   Aggravating Factors  maybe increased activity   Pain Relieving Factors medicine rest   Effect of Pain on Daily Activities work   Multiple Pain Sites No                         [facility] Adult PT Treatment/Exercise - 08/21/16 0001      Therapeutic Activites    Therapeutic  Activities ADL's   ADL's sit to stand and lunges for getting up and down on the ground     Lumbar Exercises: Aerobic   Stationary Bike Nustep - 10 min     Lumbar Exercises: Standing   Forward Lunge 10 reps  quarter lunge both sides   Other Standing Lumbar Exercises ball roll on mat (modified plank)- 10x 10 sec hold   Other Standing Lumbar Exercises standing wall push - red ball - 20x     Lumbar Exercises: Seated   Sit to Stand 20 reps  green band for increased glutes     Manual Therapy   Manual Therapy Soft tissue mobilization;Myofascial release   Soft tissue mobilization IT band, vastus lateralis   Myofascial Release IT band, vastus lateralis                  PT Short Term Goals - 08/18/16 1732      PT SHORT TERM GOAL #2   Title ability to demonstrate correct body mechanics with lifting tasks   Time 4   Period Weeks   Status On-going           PT Long Term Goals - 08/21/16  [place] #2   Title return to ballroom dancing due to improved core strength and muscle coordination   Time 8   Period Weeks   Status On-going     PT LONG TERM GOAL #3   Title work in garden without increased pain due to improved mobility and decreased muscle tightness   Time 8   Period Weeks   Status On-going     PT LONG TERM GOAL #4   Title FOTO </= 39% limitation   Time 8   Period Weeks   Status On-going     PT LONG TERM GOAL #5   Title able to walk >2 miles without increased pain or numbness in LE   Baseline walks 52mles without increased pain   Time 8   Period Weeks   Status Achieved               Plan - 08/21/16 1236    Clinical Impression Statement Patient has met long term walking goal.  She continues to have fatigue and muscle tightness with exercises.  She seems to be getting more muscle soreness verses pain from low back during work outs.  She continues to benefit from skilled PT to work on core and hip strength.   PT  Treatment/Interventions ADLs/Self Care Home Management;Biofeedback;Cryotherapy;Electrical Stimulation;Iontophoresis 451mml Dexamethasone;Moist Heat;Traction;Ultrasound;Gait training;Stair training;Functional mobility training;Therapeutic activities;Therapeutic exercise;Balance training;Neuromuscular re-education;Patient/family education;Manual techniques;Scar mobilization;Passive range of motion;Dry needling;Taping   PT Next Visit Plan progress core and hip strength and lumbar and hip ROM, single leg exercises for dancing, lifting technique   Consulted and Agree with Plan of Care Patient      Patient will benefit from skilled therapeutic intervention in order to improve the following deficits and impairments:  Decreased activity tolerance, Decreased endurance, Decreased coordination, Decreased range of motion, Difficulty walking, Decreased strength, Increased fascial restricitons, Increased muscle spasms, Pain, Postural dysfunction  Visit Diagnosis: Chronic midline low back pain without sciatica  Muscle weakness (generalized)  Unspecified lack of coordination     Problem List Patient Active Problem List   Diagnosis Date Noted  . Lumbar radiculopathy, chronic 04/08/2016  . Chest discomfort 06/13/2014  . LBBB (left bundle branch block) 06/13/2014  . 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 Dyslipidemia 06/13/2014  . Hypertension   . Plantar fasciitis, bilateral 08/03/2012  . Equinus deformity of foot, acquired 08/03/2012  . Metatarsal deformity 08/03/2012    JaZannie CovePT 08/21/2016, 1:25 PM  [facility] [address]: [phone] Fax:  [phone]: Alicia DORMINEYRN: [id number] of Birth: 7/08-10-1950

## 2016-08-26 ENCOUNTER — Encounter: Payer: 59 | Admitting: Physical Therapy

## 2016-08-29 ENCOUNTER — Ambulatory Visit: Payer: 59 | Admitting: Physical Therapy

## 2016-09-01 ENCOUNTER — Ambulatory Visit: Payer: 59 | Admitting: Physical Therapy

## 2016-09-01 ENCOUNTER — Encounter: Payer: Self-pay | Admitting: Physical Therapy

## 2016-09-01 DIAGNOSIS — M545 Low back pain: Secondary | ICD-10-CM | POA: Diagnosis not present

## 2016-09-01 DIAGNOSIS — R279 Unspecified lack of coordination: Secondary | ICD-10-CM | POA: Diagnosis not present

## 2016-09-01 DIAGNOSIS — G8929 Other chronic pain: Secondary | ICD-10-CM | POA: Diagnosis not present

## 2016-09-01 DIAGNOSIS — M6281 Muscle weakness (generalized): Secondary | ICD-10-CM | POA: Diagnosis not present

## 2016-09-01 DIAGNOSIS — Z1231 Encounter for screening mammogram for malignant neoplasm of breast: Secondary | ICD-10-CM | POA: Diagnosis not present

## 2016-09-01 NOTE — Therapy (Signed)
[facility] [address], [place], [place], [address] Phone: [phone]   Fax:  [phone]  Physical Therapy Treatment  Patient Details  Name: Alicia Hogan MRN: [id number] Date of Birth: 07/19/1950 Referring Provider: Kristeen Miss   Encounter Date: 09/01/2016      PT End of Session - 09/01/16 1107    Visit Number 9   Date for PT Re-Evaluation 09/09/16   PT Start Time 1100   PT Stop Time 1139   PT Time Calculation (min) 39 min   Activity Tolerance Patient tolerated treatment well   Behavior During Therapy [facility] for tasks assessed/performed      Past Medical History:  Diagnosis Date  . Arthritis   . COPD (chronic obstructive pulmonary disease) ([place])   . Dysrhythmia    RBB  . GERD (gastroesophageal reflux disease)   . Hypertension   . Plantar fasciitis 12/28/2013    Past Surgical History:  Procedure Laterality Date  . BACK SURGERY  11/2015   lumbar foraminotomy  . COLONOSCOPY    . MENISCUS REPAIR Left 2012  . TONSILLECTOMY     and adenoidectomy    There were no vitals filed for this visit.      Subjective Assessment - 09/01/16 1103    Subjective I have been stretchig a lot.  We got everything moved.  I have been moving stuff but made sure I didn't lift too much.   Limitations House hold activities   Patient Stated Goals strengthening    Currently in Pain? Yes   Pain Score 3    Pain Location Back   Pain Orientation Lower   Pain Descriptors / Indicators Aching   Pain Type Chronic pain   Pain Onset More than a month ago   Pain Frequency Intermittent   Aggravating Factors  increased activities, the thighs haven't changed   Pain Relieving Factors rest and stretching   Effect of Pain on Daily Activities work   Multiple Pain Sites No                         [facility] Adult PT Treatment/Exercise - 09/01/16 0001      Lumbar Exercises: Stretches   Active Hamstring Stretch 3 reps;20  seconds   ITB Stretch 3 reps;30 seconds   Piriformis Stretch 3 reps;20 seconds  piriformis, hip flexor stretch 30 sec     Lumbar Exercises: Aerobic   UBE (Upper Arm Bike) L3 x 8 min backwards with posture cues     Lumbar Exercises: Standing   Forward Lunge --   Other Standing Lumbar Exercises walking with sports cord - 30 lb - 4 ways x 5 each     Knee/Hip Exercises: Standing   Lateral Step Up Right;Left;Hand Hold: 1  on BOSU   Forward Step Up 15 reps;Right;Left;Hand Hold: 1;Step Height: 6"                  PT Short Term Goals - 09/01/16 1147      PT SHORT TERM GOAL #2   Title ability to demonstrate correct body mechanics with lifting tasks   Time 4   Period Weeks   Status Achieved           PT Long Term Goals - 09/01/16 1147      PT LONG TERM GOAL #1   Title Independent with HEP   Time 8   Period Weeks   Status On-going  PT LONG TERM GOAL #2   Title return to ballroom dancing due to improved core strength and muscle coordination   Time 8   Period Weeks   Status On-going     PT LONG TERM GOAL #3   Title work in garden without increased pain due to improved mobility and decreased muscle tightness   Time 8   Period Weeks   Status On-going     PT LONG TERM GOAL #4   Title FOTO </= 39% limitation   Time 8   Period Weeks   Status On-going               Plan - 09/01/16 1107    Clinical Impression Statement Patient was able to perform exercises today with increased resistance with walking exericse.  Pt continues to demonstrate increased ROM and flexibility.  She will benefit from skilled PT for reduced pain with functional activities.   Clinical Impairments Affecting Rehab Potential chronic pain and history of back surgery   PT Treatment/Interventions ADLs/Self Care Home Management;Biofeedback;Cryotherapy;Electrical Stimulation;Iontophoresis 4mg /ml Dexamethasone;Moist Heat;Traction;Ultrasound;Gait training;Stair training;Functional mobility  training;Therapeutic activities;Therapeutic exercise;Balance training;Neuromuscular re-education;Patient/family education;Manual techniques;Scar mobilization;Passive range of motion;Dry needling;Taping   Consulted and Agree with Plan of Care Patient      Patient will benefit from skilled therapeutic intervention in order to improve the following deficits and impairments:  Decreased activity tolerance, Decreased endurance, Decreased coordination, Decreased range of motion, Difficulty walking, Decreased strength, Increased fascial restricitons, Increased muscle spasms, Pain, Postural dysfunction  Visit Diagnosis: Chronic midline low back pain without sciatica  Muscle weakness (generalized)  Unspecified lack of coordination     Problem List Patient Active Problem List   Diagnosis Date Noted  . Lumbar radiculopathy, chronic 04/08/2016  . Chest discomfort 06/13/2014  . LBBB (left bundle branch block) 06/13/2014  . 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 Dyslipidemia 06/13/2014  . Hypertension   . Plantar fasciitis, bilateral 08/03/2012  . Equinus deformity of foot, acquired 08/03/2012  . Metatarsal deformity 08/03/2012    Zannie Cove, PT 09/01/2016, 11:49 AM  [facility] [address], [place], [place], [address] Phone: [phone]   Fax:  [phone]  Name: Alicia Hogan MRN: [id number] Date of Birth: 03/28/1950

## 2016-09-04 ENCOUNTER — Ambulatory Visit: Payer: 59 | Admitting: Physical Therapy

## 2016-09-04 DIAGNOSIS — G8929 Other chronic pain: Secondary | ICD-10-CM

## 2016-09-04 DIAGNOSIS — R279 Unspecified lack of coordination: Secondary | ICD-10-CM | POA: Diagnosis not present

## 2016-09-04 DIAGNOSIS — M6281 Muscle weakness (generalized): Secondary | ICD-10-CM | POA: Diagnosis not present

## 2016-09-04 DIAGNOSIS — M545 Low back pain: Secondary | ICD-10-CM | POA: Diagnosis not present

## 2016-09-04 DIAGNOSIS — Z1231 Encounter for screening mammogram for malignant neoplasm of breast: Secondary | ICD-10-CM | POA: Diagnosis not present

## 2016-09-04 NOTE — Therapy (Signed)
[facility] [address], [place], [place], [address] Phone: [phone]   Fax:  [phone]  Physical Therapy Treatment  Patient Details  Name: Alicia Hogan MRN: [id number] Date of Birth: 1950/11/13 Referring Provider: Kristeen Miss   Encounter Date: 09/04/2016      PT End of Session - 09/04/16 0933    Visit Number 10   Date for PT Re-Evaluation 09/09/16   PT Start Time 0930   PT Stop Time 1011   PT Time Calculation (min) 41 min   Activity Tolerance Patient tolerated treatment well   Behavior During Therapy [facility] for tasks assessed/performed      Past Medical History:  Diagnosis Date  . Arthritis   . COPD (chronic obstructive pulmonary disease) ([place])   . Dysrhythmia    RBB  . GERD (gastroesophageal reflux disease)   . Hypertension   . Plantar fasciitis 12/28/2013    Past Surgical History:  Procedure Laterality Date  . BACK SURGERY  11/2015   lumbar foraminotomy  . COLONOSCOPY    . MENISCUS REPAIR Left 2012  . TONSILLECTOMY     and adenoidectomy    There were no vitals filed for this visit.      Subjective Assessment - 09/04/16 0936    Subjective I had a lot of pain last night and have been a little more busy at home. My pain was 4/10.     Limitations House hold activities   Patient Stated Goals strengthening    Currently in Pain? Yes   Pain Score 2    Pain Location Back   Pain Orientation Lower   Pain Descriptors / Indicators Aching   Pain Type Chronic pain   Pain Onset More than a month ago   Pain Frequency Intermittent   Aggravating Factors  increased activities, end of the day   Pain Relieving Factors rest and NSAIDs   Multiple Pain Sites No                         [facility] Adult PT Treatment/Exercise - 09/04/16 0001      Neuro Re-ed    Neuro Re-ed Details  focus on isolating hip movement with core contraction     Lumbar Exercises: Aerobic   Stationary Bike Nustep -  L3; 10 min     Lumbar Exercises: Machines for Strengthening   Leg Press bilateral 110#; single 55# - 2x10     Lumbar Exercises: Standing   Forward Lunge 15 reps  BOSU   Side Lunge 15 reps  on BOSU   Other Standing Lumbar Exercises oblique punches, side step holding band - green - 20x each way   Other Standing Lumbar Exercises hip rotation bilateral and single leg - 20x each                  PT Short Term Goals - 09/01/16 1147      PT SHORT TERM GOAL #2   Title ability to demonstrate correct body mechanics with lifting tasks   Time 4   Period Weeks   Status Achieved           PT Long Term Goals - 09/04/16 1015      PT LONG TERM GOAL #1   Title Independent with HEP   Time 8   Period Weeks   Status On-going     PT LONG TERM GOAL #2   Title return to ballroom dancing due to  improved core strength and muscle coordination   Time 8   Period Weeks   Status On-going     PT LONG TERM GOAL #3   Title work in garden without increased pain due to improved mobility and decreased muscle tightness   Time 8   Period Weeks   Status On-going     PT LONG TERM GOAL #4   Title FOTO </= 39% limitation   Time 8   Period Weeks   Status On-going               Plan - 09/04/16 1012    Clinical Impression Statement Pt did well, no increased pain today.  Added isolated hip rotation and oblique muscle activation.  Pt will plan on discharge at next visit for review of HEP.   Clinical Impairments Affecting Rehab Potential chronic pain and history of back surgery   PT Treatment/Interventions ADLs/Self Care Home Management;Biofeedback;Cryotherapy;Electrical Stimulation;Iontophoresis 4mg /ml Dexamethasone;Moist Heat;Traction;Ultrasound;Gait training;Stair training;Functional mobility training;Therapeutic activities;Therapeutic exercise;Balance training;Neuromuscular re-education;Patient/family education;Manual techniques;Scar mobilization;Passive range of motion;Dry  needling;Taping   PT Next Visit Plan review HEP, plan for discharge next visit   Consulted and Agree with Plan of Care Patient      Patient will benefit from skilled therapeutic intervention in order to improve the following deficits and impairments:  Decreased activity tolerance, Decreased endurance, Decreased coordination, Decreased range of motion, Difficulty walking, Decreased strength, Increased fascial restricitons, Increased muscle spasms, Pain, Postural dysfunction  Visit Diagnosis: Chronic midline low back pain without sciatica  Muscle weakness (generalized)  Unspecified lack of coordination     Problem List Patient Active Problem List   Diagnosis Date Noted  . Lumbar radiculopathy, chronic 04/08/2016  . Chest discomfort 06/13/2014  . LBBB (left bundle branch block) 06/13/2014  . 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 Dyslipidemia 06/13/2014  . Hypertension   . Plantar fasciitis, bilateral 08/03/2012  . Equinus deformity of foot, acquired 08/03/2012  . Metatarsal deformity 08/03/2012    Zannie Cove, PT 09/04/2016, 10:17 AM  [facility] [address] [place], [place], [address] Phone: [phone]   Fax:  [phone]  Name: Alicia Hogan MRN: [id number] Date of Birth: [birth year]

## 2016-09-05 ENCOUNTER — Other Ambulatory Visit: Payer: Self-pay | Admitting: Podiatry

## 2016-09-05 ENCOUNTER — Encounter: Payer: Self-pay | Admitting: Podiatry

## 2016-09-05 ENCOUNTER — Ambulatory Visit (INDEPENDENT_AMBULATORY_CARE_PROVIDER_SITE_OTHER): Payer: 59

## 2016-09-05 ENCOUNTER — Ambulatory Visit (INDEPENDENT_AMBULATORY_CARE_PROVIDER_SITE_OTHER): Payer: 59 | Admitting: Podiatry

## 2016-09-05 VITALS — BP 163/95 | HR 65 | Resp 16

## 2016-09-05 DIAGNOSIS — G629 Polyneuropathy, unspecified: Secondary | ICD-10-CM | POA: Diagnosis not present

## 2016-09-05 DIAGNOSIS — M779 Enthesopathy, unspecified: Secondary | ICD-10-CM

## 2016-09-05 DIAGNOSIS — M79671 Pain in right foot: Secondary | ICD-10-CM

## 2016-09-05 DIAGNOSIS — M79672 Pain in left foot: Principal | ICD-10-CM

## 2016-09-05 MED FILL — GABAPENTIN 300 MG CAPS: 300 | 90 days supply | Qty: 180 | Fill #2

## 2016-09-07 NOTE — Progress Notes (Signed)
Subjective:    Patient ID: Alicia Hogan, female   DOB: 66 y.o.   MRN: [id number]   HPI patient states she needs new orthotics as her old ones has started to wear out and are not as effective. Has had significant back surgery with fusion    ROS      Objective:  Physical Exam neurovascular status intact with patient found to have moderate depression of the arch bilateral foot pain and history of plantar fascial symptomatology     Assessment:    Moderate plantar fasciitis kept under reasonable control with structural orthotics     Plan:    H&P x-rays reviewed and scanned for customized orthotics at this time. Reappoint when orthotics returned  X-rays indicate moderate depression of the arch spur formation with no indication of stress fracture or advanced arthritis

## 2016-09-08 ENCOUNTER — Ambulatory Visit: Payer: 59 | Admitting: Physical Therapy

## 2016-09-08 ENCOUNTER — Encounter: Payer: Self-pay | Admitting: Physical Therapy

## 2016-09-08 DIAGNOSIS — G8929 Other chronic pain: Secondary | ICD-10-CM | POA: Diagnosis not present

## 2016-09-08 DIAGNOSIS — R279 Unspecified lack of coordination: Secondary | ICD-10-CM

## 2016-09-08 DIAGNOSIS — M545 Low back pain: Secondary | ICD-10-CM | POA: Diagnosis not present

## 2016-09-08 DIAGNOSIS — M6281 Muscle weakness (generalized): Secondary | ICD-10-CM | POA: Diagnosis not present

## 2016-09-08 DIAGNOSIS — Z1231 Encounter for screening mammogram for malignant neoplasm of breast: Secondary | ICD-10-CM | POA: Diagnosis not present

## 2016-09-08 MED FILL — HYDROCHLOROTHIAZIDE 25 MG T: 25 | 30 days supply | Qty: 30 | Fill #2

## 2016-09-08 NOTE — Therapy (Signed)
[facility] [address], [place], [place], [address] Phone: [phone]   Fax:  [phone]  Physical Therapy Treatment  Patient Details  Name: Alicia Hogan MRN: [id number] Date of Birth: 1950/11/24 Referring Provider: Kristeen Miss   Encounter Date: 09/08/2016      PT End of Session - 09/08/16 0955    Visit Number 11   Date for PT Re-Evaluation 09/09/16   PT Start Time 0933   PT Stop Time 1012   PT Time Calculation (min) 39 min   Activity Tolerance Patient tolerated treatment well   Behavior During Therapy [facility] for tasks assessed/performed      Past Medical History:  Diagnosis Date  . Arthritis   . COPD (chronic obstructive pulmonary disease) ([place])   . Dysrhythmia    RBB  . GERD (gastroesophageal reflux disease)   . Hypertension   . Plantar fasciitis 12/28/2013    Past Surgical History:  Procedure Laterality Date  . BACK SURGERY  11/2015   lumbar foraminotomy  . COLONOSCOPY    . MENISCUS REPAIR Left 2012  . TONSILLECTOMY     and adenoidectomy    There were no vitals filed for this visit.      Subjective Assessment - 09/08/16 0944    Subjective I am still getting pain at night that is up to a 4/10, but overally it is better.  I am just hoping things don't get worse.   Limitations House hold activities   Patient Stated Goals strengthening    Currently in Pain? Yes   Pain Score 2    Pain Location Back   Pain Orientation Lower   Pain Descriptors / Indicators Aching   Pain Type Chronic pain   Pain Onset More than a month ago   Pain Frequency Intermittent   Aggravating Factors  increased activities, end of the day   Pain Relieving Factors rest   Multiple Pain Sites No                         [facility] Adult PT Treatment/Exercise - 09/08/16 0001      Lumbar Exercises: Machines for Strengthening   Leg Press bilateral 115#; single 55# - 3x10     Lumbar Exercises: Standing    Forward Lunge 15 reps   Other Standing Lumbar Exercises UE flexion  with brace abdominals     Lumbar Exercises: Sidelying   Clam 20 reps  clam and reverse clam   Hip Abduction 10 reps                PT Education - 09/08/16 1153    Education provided Yes   Education Details reverse clam, lunges   Person(s) Educated Patient   Methods Explanation;Handout;Demonstration   Comprehension Verbalized understanding;Returned demonstration          PT Short Term Goals - 09/01/16 1147      PT SHORT TERM GOAL #2   Title ability to demonstrate correct body mechanics with lifting tasks   Time 4   Period Weeks   Status Achieved           PT Long Term Goals - 09/08/16 0947      PT LONG TERM GOAL #1   Title Independent with HEP   Time 8   Period Weeks   Status Achieved     PT LONG TERM GOAL #2   Title return to ballroom dancing due to improved core strength and  muscle coordination   Time 8   Period Weeks   Status Not Met     PT LONG TERM GOAL #3   Title work in garden without increased pain due to improved mobility and decreased muscle tightness   Time 8   Period Weeks   Status Not Met     PT LONG TERM GOAL #4   Title FOTO </= 39% limitation   Time 8   Period Weeks   Status On-going     PT LONG TERM GOAL #5   Title able to walk >2 miles without increased pain or numbness in LE   Time 8   Period Weeks   Status Achieved               Plan - 09/08/16 0955    Clinical Impression Statement Pt will discharge with HEP today.   Clinical Impairments Affecting Rehab Potential chronic pain and history of back surgery   PT Treatment/Interventions ADLs/Self Care Home Management;Biofeedback;Cryotherapy;Electrical Stimulation;Iontophoresis 60m/ml Dexamethasone;Moist Heat;Traction;Ultrasound;Gait training;Stair training;Functional mobility training;Therapeutic activities;Therapeutic exercise;Balance training;Neuromuscular re-education;Patient/family  education;Manual techniques;Scar mobilization;Passive range of motion;Dry needling;Taping   PT Next Visit Plan discharge today   Consulted and Agree with Plan of Care Patient      Patient will benefit from skilled therapeutic intervention in order to improve the following deficits and impairments:  Decreased activity tolerance, Decreased endurance, Decreased coordination, Decreased range of motion, Difficulty walking, Decreased strength, Increased fascial restricitons, Increased muscle spasms, Pain, Postural dysfunction  Visit Diagnosis: Chronic midline low back pain without sciatica  Muscle weakness (generalized)  Unspecified lack of coordination     Problem List Patient Active Problem List   Diagnosis Date Noted  . Lumbar radiculopathy, chronic 04/08/2016  . Chest discomfort 06/13/2014  . LBBB (left bundle branch block) 06/13/2014  . 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 Dyslipidemia 06/13/2014  . Hypertension   . Plantar fasciitis, bilateral 08/03/2012  . Equinus deformity of foot, acquired 08/03/2012  . Metatarsal deformity 08/03/2012    JZannie Cove PT 09/08/2016, 12:46 PM   [facility] [address] [place] [address]Phone: [phone]  Fax:  [phone] Name: Alicia KUCKMRN: [id number] of Birth: 709/28/1952 PHYSICAL THERAPY DISCHARGE SUMMARY  Visits from Start of Care: 11  Current functional level related to goals / functional outcomes: See above, partial met goals, pt able to progress with HEP now   Remaining deficits: See above   Education / Equipment:  HEP Plan: Patient agrees to discharge.  Patient goals were partially met. Patient is being discharged due to lack of progress.  ?????         JZannie Cove PT 09/08/16 12:46 PM

## 2016-09-08 NOTE — Patient Instructions (Addendum)
   Reverse Clamshell  While lying on your side, stack your legs one on top of the other and bend your knees. Raise the top foot off of the bottom foot while keeping the knees together. Lower the foot back down and repeat the motion.    LUNGE  Start by standing with feet shoulder-width-apart. Next, take a step forward and allow your front knee to bend. Your back knee may bend as well. Then, return to original position, or you may walk and take a step forward and repeat with the other leg.  Keep your pelvis level and straight the entire time.   Your front knee should bend in line with the 2nd toe and not pass the front of the foot.  10 x each side    Standing Flexion  Start with elbows straight, arms at your side, lift weights upward to shoulder height. Squeezing your shoulder blades down and in. Lower arms and repeat.  2 x 10

## 2016-09-12 ENCOUNTER — Other Ambulatory Visit: Payer: Self-pay | Admitting: Family Medicine

## 2016-09-12 DIAGNOSIS — Z1231 Encounter for screening mammogram for malignant neoplasm of breast: Secondary | ICD-10-CM

## 2016-09-16 DIAGNOSIS — H5213 Myopia, bilateral: Secondary | ICD-10-CM | POA: Diagnosis not present

## 2016-09-16 DIAGNOSIS — H5005 Alternating esotropia: Secondary | ICD-10-CM | POA: Diagnosis not present

## 2016-09-19 ENCOUNTER — Ambulatory Visit
Admission: RE | Admit: 2016-09-19 | Discharge: 2016-09-19 | Disposition: A | Discharge status: Home / Self Care | Payer: 59 | Source: Ambulatory Visit | Attending: Family Medicine | Admitting: Family Medicine

## 2016-09-19 DIAGNOSIS — Z1231 Encounter for screening mammogram for malignant neoplasm of breast: Secondary | ICD-10-CM

## 2016-09-25 ENCOUNTER — Other Ambulatory Visit (INDEPENDENT_AMBULATORY_CARE_PROVIDER_SITE_OTHER): Payer: 59 | Admitting: Orthotics

## 2016-10-08 ENCOUNTER — Telehealth: Payer: Self-pay | Admitting: Cardiovascular Disease

## 2016-10-08 DIAGNOSIS — R0609 Other forms of dyspnea: Principal | ICD-10-CM

## 2016-10-08 DIAGNOSIS — R002 Palpitations: Secondary | ICD-10-CM

## 2016-10-08 MED FILL — LOSARTAN POTASSIUM 100 MG T: 100 | 90 days supply | Qty: 90 | Fill #1

## 2016-10-08 MED FILL — HYDROCHLOROTHIAZIDE 25 MG T: 25 | 30 days supply | Qty: 30 | Fill #3

## 2016-10-08 NOTE — Telephone Encounter (Signed)
Called patient about her message. Patient has been feeling like her heart is out of rhythm at times and feels like she has to catch her breath. Patient stated this started happening this week. Patient complained of SOB with activity (walking up a hill), energy level being low, and added stress (back surgery-03/2016 and recent death in the family). Patient stated her BP was up and her PCP increased her losartan to 100 mg and added HCTZ 25 mg.  Now patient's BP is 106/50 and HR 60's. Asked patient about her caffeine intake. Patient stated she has two cups of coffee in the am and 1 cup in the evening. Patient has an up coming appointment on 11/06/16. Will see if Dr. Johnsie Cancel wants to get any testing done before OV.  Will send message to Dr. Johnsie Cancel for advisement.

## 2016-10-08 NOTE — Telephone Encounter (Signed)
Order echo and 48 hour holter

## 2016-10-08 NOTE — Telephone Encounter (Signed)
Called patient back. Informed patient that Dr. Johnsie Cancel recommends she have an echo and holter monitor. Informed patient that scheduling will call to make an appointment for these two test. Patient verbalized understanding. Sent [facility] message to call patient to schedule echo and monitor.

## 2016-10-08 NOTE — Telephone Encounter (Signed)
New message  Pt call requesting to speak with RN. Pt states she has been feeling her heart out of rhythm. Pt did schedule an appt with Nishan for 8/27, but pt would like to know would she be out to wait until then for an appt. Please call back to discuss

## 2016-10-10 ENCOUNTER — Other Ambulatory Visit: Payer: Self-pay

## 2016-10-10 ENCOUNTER — Ambulatory Visit (HOSPITAL_COMMUNITY): Payer: 59 | Attending: Cardiovascular Disease

## 2016-10-10 DIAGNOSIS — R002 Palpitations: Secondary | ICD-10-CM | POA: Insufficient documentation

## 2016-10-10 DIAGNOSIS — R0609 Other forms of dyspnea: Secondary | ICD-10-CM | POA: Insufficient documentation

## 2016-10-10 DIAGNOSIS — I119 Hypertensive heart disease without heart failure: Secondary | ICD-10-CM | POA: Diagnosis not present

## 2016-10-10 DIAGNOSIS — J449 Chronic obstructive pulmonary disease, unspecified: Secondary | ICD-10-CM | POA: Diagnosis not present

## 2016-10-14 ENCOUNTER — Ambulatory Visit (INDEPENDENT_AMBULATORY_CARE_PROVIDER_SITE_OTHER): Payer: 59

## 2016-10-14 DIAGNOSIS — R002 Palpitations: Secondary | ICD-10-CM

## 2016-10-14 DIAGNOSIS — R0609 Other forms of dyspnea: Secondary | ICD-10-CM

## 2016-10-22 MED FILL — METOPROLOL SUCC ER 25 MG TA: 25 | 90 days supply | Qty: 90 | Fill #1

## 2016-10-23 DIAGNOSIS — M5416 Radiculopathy, lumbar region: Secondary | ICD-10-CM | POA: Diagnosis not present

## 2016-10-23 DIAGNOSIS — Z6834 Body mass index (BMI) 34.0-34.9, adult: Secondary | ICD-10-CM | POA: Diagnosis not present

## 2016-10-23 DIAGNOSIS — I10 Essential (primary) hypertension: Secondary | ICD-10-CM | POA: Diagnosis not present

## 2016-10-23 MED FILL — HYDROCODON-APAP 5-325: 5-325 | 15 days supply | Qty: 60 | Fill #0

## 2016-10-24 ENCOUNTER — Encounter: Payer: Self-pay | Admitting: Cardiovascular Disease

## 2016-11-03 MED FILL — AMOXICILLIN 500 MG CAPSULE: 500 | 10 days supply | Qty: 30 | Fill #0

## 2016-11-04 NOTE — Progress Notes (Signed)
Cardiology Office Note   Date:  11/06/2016   ID:  Hogan, Alicia Jun 27, 1950, MRN [id number]  PCP:  Maurice Small, MD  Cardiologist: Darlin Coco MD  No chief complaint on file.     History of Present Illness: Alicia Hogan is a 66 y.o.  female who presents for cardiology follow-up. Previously seen by Dr Mare Ferrari on 06/13/14 for evaluation of PVCs and a new left bundle branch block.  She underwent Lexiscan Myoview stress test on 06/23/14.  The ejection fraction was normal at 64% and there were no perfusion deficits.  The patient also underwent an echocardiogram on 06/23/14.  The echo showed mild LVH and was otherwise normal with ejection fraction of 55-60%.  She was placed on Lopressor 12.5 mg twice a day for control of her PVCs.  She reports that her PVCs have resolved.  She has not been having any exertional chest pain.  She does have some mild exertional dyspnea when she walks up a steep hill.  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  She tries to walk up the stairs at the hospital for exercise.  She also walks in her neighborhood with her husband and they also enjoy dancing for exercise   Some issues with plantar Fasciatus sees Dr Paulla Dolly  Thinks this relulted In neuropathy that she takes Neurontin for  Also L5/S1 disc dx and has had injections   Called office end of August 09/2016 complaining of palpitations and exertional dyspnea   Echo: 10/10/16 reviewed Normal EF 55-60% Holter: 10/14/16 reviewed SR infrequent unifocal PVCls less than 1% of total beats  Had failed L5/S1 disectomy and needed a fusion with Dr Ellene Route in February Slow to heal. Will likely stay on disability as nurse on [employer] until she retires next July  Occasional palpitation but better on beta blocker. Doing PT to loosen hip flexors And hamstrings    Past Medical History:  Diagnosis Date  . Arthritis   . COPD (chronic obstructive pulmonary disease) ([place])   . Dysrhythmia    RBB  .  GERD (gastroesophageal reflux disease)   . Hypertension   . Plantar fasciitis 12/28/2013    Past Surgical History:  Procedure Laterality Date  . BACK SURGERY  11/2015   lumbar foraminotomy  . COLONOSCOPY    . MENISCUS REPAIR Left 2012  . TONSILLECTOMY     and adenoidectomy     Current Outpatient Prescriptions  Medication Sig Dispense Refill  . aspirin 81 MG tablet Take 81 mg by mouth daily.    Marland Kitchen gabapentin (NEURONTIN) 300 MG capsule Take 600 mg by mouth at bedtime. Reported on 06/04/2015    . hydrochlorothiazide (HYDRODIURIL) 25 MG tablet Take 25 mg by mouth every morning.  11  . losartan (COZAAR) 100 MG tablet Take 100 mg by mouth daily.  3  . lovastatin (MEVACOR) 40 MG tablet Take 20 mg by mouth every evening.  2  . metoprolol succinate (TOPROL XL) 25 MG 24 hr tablet Take 1 tablet (25 mg total) by mouth daily. 90 tablet 3  . Multiple Vitamins-Minerals (MULTIVITAMIN PO) Take 1 tablet by mouth daily.     Current Facility-Administered Medications  Medication Dose Route Frequency Provider Last Rate Last Dose  . Arch Bandage MISC 2 each  2 each Does not apply Once Sheard, Myeong O, DPM        Allergies:   Lisinopril and Codeine    Social History:  The patient  reports that she quit smoking about 8 years ago. Her smoking use included Cigarettes. She started smoking about 47 years ago. She has a 43.00 pack-year smoking history. She has never used smokeless tobacco. She reports that she does not drink alcohol or use drugs.   Family History:  The patient's  family history includes Alzheimer's disease in her mother; Diabetes in her maternal grandmother; Hypertension in her mother; Lung cancer in her father; Stroke in her maternal grandmother.    ROS:  Please see the history of present illness.   Otherwise, review of systems are positive for none.   All other systems are reviewed and negative.    PHYSICAL EXAM: VS:  BP 140/76   Pulse 77   Ht 5\' 9"  (1.753 m)   Wt 227 lb (103 kg)    SpO2 96%   BMI 33.52 kg/m  , BMI Body mass index is 33.52 kg/m. Affect appropriate Healthy:  appears stated age 89: normal Neck supple with no adenopathy JVP normal no bruits no thyromegaly Lungs clear with no wheezing and good diaphragmatic motion Heart:  S1/S2 no murmur, no rub, gallop or click PMI normal Abdomen: benighn, BS positve, no tenderness, no AAA no bruit.  No HSM or HJR Distal pulses intact with no bruits No edema Neuro non-focal Skin warm and dry No muscular weakness    EKG:   10/04/14 normal sinus rhythm at 60 bpm.  Left bundle branch block is unchanged.  Since the previous tracing her PVCs have disappeared. 09/18/15  SR rate 59 normal SR rate 62 low voltage normal    Recent Labs: 04/09/2016: BUN 12; Creatinine, Ser 0.96; Hemoglobin 11.7; Platelets 223; Potassium 4.9; Sodium 140    Lipid Panel No results found for: CHOL, TRIG, HDL, CHOLHDL, VLDL, LDLCALC, LDLDIRECT    Wt Readings from Last 3 Encounters:  11/06/16 227 lb (103 kg)  04/08/16 230 lb 7 oz (104.5 kg)  04/01/16 230 lb 7 oz (104.5 kg)         ASSESSMENT AND PLAN:  1.  Left bundle branch block, currently asymptomatic.  Myoview showed no evidence of ischemia.  She has normal left ventricular systolic function. ECG normal today  2.  Essential hypertension controlled on current medications 3.  Plantar fasciitis. Continue orthotics and f/u [facility] 4. Neuropathy  qhs Neurontin stable no diabetes  5. Chol:  Continue statin labs with Dr Justin Mend 6. Palpitations: benign limited PVCls on holter in setting normal EF continue beta blocker Rx 7. Dyspnea: normal exam normal echo Former smoker with 43 pack year history Lung cancer Screening CT 12/10/15 showed diffuse bronchial wall thickening and emphysema   Disposition:  F/u in a year     [employer]

## 2016-11-06 ENCOUNTER — Encounter: Payer: Self-pay | Admitting: Cardiovascular Disease

## 2016-11-06 ENCOUNTER — Ambulatory Visit (INDEPENDENT_AMBULATORY_CARE_PROVIDER_SITE_OTHER): Payer: 59 | Admitting: Cardiovascular Disease

## 2016-11-06 VITALS — BP 140/76 | HR 77 | Ht 69.0 in | Wt 227.0 lb

## 2016-11-06 DIAGNOSIS — I447 Left bundle-branch block, unspecified: Secondary | ICD-10-CM | POA: Diagnosis not present

## 2016-11-06 DIAGNOSIS — E785 Hyperlipidemia, unspecified: Secondary | ICD-10-CM | POA: Diagnosis not present

## 2016-11-06 DIAGNOSIS — I10 Essential (primary) hypertension: Secondary | ICD-10-CM | POA: Diagnosis not present

## 2016-11-06 NOTE — Patient Instructions (Addendum)

## 2016-11-10 MED FILL — HYDROCHLOROTHIAZIDE 25 MG T: 25 | 90 days supply | Qty: 90 | Fill #4

## 2016-11-18 DIAGNOSIS — I2510 Atherosclerotic heart disease of native coronary artery without angina pectoris: Secondary | ICD-10-CM | POA: Diagnosis not present

## 2016-11-18 DIAGNOSIS — R7303 Prediabetes: Secondary | ICD-10-CM | POA: Diagnosis not present

## 2016-11-18 DIAGNOSIS — E785 Hyperlipidemia, unspecified: Secondary | ICD-10-CM | POA: Diagnosis not present

## 2016-11-18 DIAGNOSIS — I10 Essential (primary) hypertension: Secondary | ICD-10-CM | POA: Diagnosis not present

## 2016-11-18 DIAGNOSIS — J432 Centrilobular emphysema: Secondary | ICD-10-CM | POA: Diagnosis not present

## 2016-11-18 DIAGNOSIS — R911 Solitary pulmonary nodule: Secondary | ICD-10-CM | POA: Diagnosis not present

## 2016-11-18 DIAGNOSIS — Z0000 Encounter for general adult medical examination without abnormal findings: Secondary | ICD-10-CM | POA: Diagnosis not present

## 2016-11-18 DIAGNOSIS — Z23 Encounter for immunization: Secondary | ICD-10-CM | POA: Diagnosis not present

## 2016-11-26 MED FILL — LOVASTATIN 40 MG TABS: 40 | 90 days supply | Qty: 45 | Fill #3

## 2016-12-02 ENCOUNTER — Ambulatory Visit: Payer: Self-pay | Admitting: General Surgery

## 2016-12-02 DIAGNOSIS — K643 Fourth degree hemorrhoids: Secondary | ICD-10-CM | POA: Diagnosis not present

## 2016-12-02 NOTE — H&P (View-Only) (Signed)
History of Present Illness Alicia Ruff MD; 95/62/1308 9:30 AM) The patient is a 66 year old female who presents with hemorrhoids. 67 year old female who presents to the office for evaluation of hemorrhoids. She states she has had minor problems with these for several years. She recently underwent spinal fusion surgery and required narcotics for several months. This seemed to worsen her hemorrhoids. She complains of bleeding as well as pain with bowel movements. She has difficulty with hygiene. She does report regular bowel movements and eats a high-fiber diet. She is up-to-date on her colonoscopies.   Past Surgical History Alicia Hogan, [place]; 12/02/2016 9:03 AM) Knee Surgery Left. Spinal Surgery - Lower Back  Diagnostic Studies History Alicia Hogan, [place]; 12/02/2016 9:03 AM) Colonoscopy within last year Mammogram 1-3 years ago Pap Smear 1-5 years ago  Allergies Alicia Hogan, RMA; 12/02/2016 9:04 AM) Lisinopril *ANTIHYPERTENSIVES* Cough. CODEINE Nausea, Vomiting.  Medication History Alicia Hogan, RMA; 12/02/2016 9:05 AM) Gabapentin (300MG  Capsule, Oral) Active. HydroCHLOROthiazide (25MG  Tablet, Oral) Active. Losartan Potassium (100MG  Tablet, Oral) Active. Lovastatin (40MG  Tablet, Oral) Active. Metoprolol Succinate ER (25MG  Tablet ER 24HR, Oral) Active. Multivitamins/Minerals (Oral) Active. Aspirin (81MG  Tablet, Oral) Active. Medications Reconciled  Social History Alicia Hogan, [place]; 12/02/2016 9:03 AM) Alcohol use Occasional alcohol use. Caffeine use Coffee. No drug use Tobacco use Former smoker.  Family History Alicia Hogan, [place]; 12/02/2016 9:03 AM) Cancer Father. Hypertension Mother. Migraine Headache Daughter. Respiratory Condition Father.  Pregnancy / Birth History Alicia Hogan, [place]; 12/02/2016 9:03 AM) Age of menopause 56-50 Contraceptive History Oral contraceptives. Gravida 1 Length (months) of  breastfeeding 7-12 Maternal age 68-25 Para 66  Other Problems Alicia Hogan, [place]; 12/02/2016 9:03 AM) Back Pain Hemorrhoids High blood pressure Hypercholesterolemia     Review of Systems Alicia Hogan RMA; 12/02/2016 9:03 AM) General Not Present- Appetite Loss, Chills, Fatigue, Fever, Night Sweats, Weight Gain and Weight Loss. Skin Not Present- Change in Wart/Mole, Dryness, Hives, Jaundice, New Lesions, Non-Healing Wounds, Rash and Ulcer. HEENT Present- Seasonal Allergies and Wears glasses/contact lenses. Not Present- Earache, Hearing Loss, Hoarseness, Nose Bleed, Oral Ulcers, Ringing in the Ears, Sinus Pain, Sore Throat, Visual Disturbances and Yellow Eyes. Respiratory Not Present- Bloody sputum, Chronic Cough, Difficulty Breathing, Snoring and Wheezing. Breast Not Present- Breast Mass, Breast Pain, Nipple Discharge and Skin Changes. Cardiovascular Not Present- Chest Pain, Difficulty Breathing Lying Down, Leg Cramps, Palpitations, Rapid Heart Rate, Shortness of Breath and Swelling of Extremities. Gastrointestinal Present- Hemorrhoids and Rectal Pain. Not Present- Abdominal Pain, Bloating, Bloody Stool, Change in Bowel Habits, Chronic diarrhea, Constipation, Difficulty Swallowing, Excessive gas, Gets full quickly at meals, Indigestion, Nausea and Vomiting. Female Genitourinary Not Present- Frequency, Nocturia, Painful Urination, Pelvic Pain and Urgency. Musculoskeletal Present- Back Pain. Not Present- Joint Pain, Joint Stiffness, Muscle Pain, Muscle Weakness and Swelling of Extremities. Neurological Not Present- Decreased Memory, Fainting, Headaches, Numbness, Seizures, Tingling, Tremor, Trouble walking and Weakness. Psychiatric Not Present- Anxiety, Bipolar, Change in Sleep Pattern, Depression, Fearful and Frequent crying. Endocrine Not Present- Cold Intolerance, Excessive Hunger, Hair Changes, Heat Intolerance, Hot flashes and New Diabetes. Hematology Not Present- Blood  Thinners, Easy Bruising, Excessive bleeding, Gland problems, HIV and Persistent Infections.  Vitals Alicia Hogan RMA; 12/02/2016 9:05 AM) 12/02/2016 9:05 AM Weight: 229 lb Height: 67.5in Body Surface Area: 2.15 m Body Mass Index: 35.34 kg/m  Temp.: 97.19F  Pulse: 81 (Regular)  BP: 140/80 (Sitting, Left Arm, Standard)      Physical Exam Alicia Ruff MD; 65/78/4696 9:30 AM)  General Mental Status-Alert. General Appearance-Not in acute  distress. Build & Nutrition-Well nourished. Posture-Normal posture. Gait-Normal.  Head and Neck Head-normocephalic, atraumatic with no lesions or palpable masses. Trachea-midline.  Chest and Lung Exam Chest and lung exam reveals -on auscultation, normal breath sounds, no adventitious sounds and normal vocal resonance.  Cardiovascular Cardiovascular examination reveals -normal heart sounds, regular rate and rhythm with no murmurs and no digital clubbing, cyanosis, edema, increased warmth or tenderness.  Abdomen Inspection Inspection of the abdomen reveals - No Hernias. Palpation/Percussion Palpation and Percussion of the abdomen reveal - Soft, Non Tender, No Rigidity (guarding), No hepatosplenomegaly and No Palpable abdominal masses.  Rectal Anorectal Exam External - skin tag. Internal - localized tenderness and decreased sphincter tone.  Neurologic Neurologic evaluation reveals -alert and oriented x 3 with no impairment of recent or remote memory, normal attention span and ability to concentrate, normal sensation and normal coordination.  Musculoskeletal Normal Exam - Bilateral-Upper Extremity Strength Normal and Lower Extremity Strength Normal.    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 She is having trouble with bleeding and pain on  a daily basis. On exam she has a very tender grade 4 internal hemorrhoid. I am unable to complete an anoscopic exam due to pain. I have recommended a complete hemorrhoidectomy. We have discussed this in detail including postoperative pain and bleeding, urinary retention, anal stenosis and fecal incontinence. I believe she understands these risk and has agreed to proceed with surgery.

## 2016-12-02 NOTE — H&P (Signed)
History of Present Illness Alicia Ruff MD; 62/26/3335 9:30 AM) The patient is a 66 year old female who presents with hemorrhoids. 66 year old female who presents to the office for evaluation of hemorrhoids. She states she has had minor problems with these for several years. She recently underwent spinal fusion surgery and required narcotics for several months. This seemed to worsen her hemorrhoids. She complains of bleeding as well as pain with bowel movements. She has difficulty with hygiene. She does report regular bowel movements and eats a high-fiber diet. She is up-to-date on her colonoscopies.   Past Surgical History [name], [place]; 12/02/2016 9:03 AM) Knee Surgery Left. Spinal Surgery - Lower Back  Diagnostic Studies History [name], [place]; 12/02/2016 9:03 AM) Colonoscopy within last year Mammogram 1-3 years ago Pap Smear 1-5 years ago  Allergies [name], RMA; 12/02/2016 9:04 AM) Lisinopril *ANTIHYPERTENSIVES* Cough. CODEINE Nausea, Vomiting.  Medication History [name], RMA; 12/02/2016 9:05 AM) Gabapentin (300MG  Capsule, Oral) Active. HydroCHLOROthiazide (25MG  Tablet, Oral) Active. Losartan Potassium (100MG  Tablet, Oral) Active. Lovastatin (40MG  Tablet, Oral) Active. Metoprolol Succinate ER (25MG  Tablet ER 24HR, Oral) Active. Multivitamins/Minerals (Oral) Active. Aspirin (81MG  Tablet, Oral) Active. Medications Reconciled  Social History [name], [place]; 12/02/2016 9:03 AM) Alcohol use Occasional alcohol use. Caffeine use Coffee. No drug use Tobacco use Former smoker.  Family History [name], [place]; 12/02/2016 9:03 AM) Cancer Father. Hypertension Mother. Migraine Headache Daughter. Respiratory Condition Father.  Pregnancy / Birth History [name], [place]; 12/02/2016 9:03 AM) Age of menopause 4-50 Contraceptive History Oral contraceptives. Gravida 1 Length (months) of  breastfeeding 7-12 Maternal age 27-25 Para 18  Other Problems [name], [place]; 12/02/2016 9:03 AM) Back Pain Hemorrhoids High blood pressure Hypercholesterolemia     Review of Systems [name] RMA; 12/02/2016 9:03 AM) General Not Present- Appetite Loss, Chills, Fatigue, Fever, Night Sweats, Weight Gain and Weight Loss. Skin Not Present- Change in Wart/Mole, Dryness, Hives, Jaundice, New Lesions, Non-Healing Wounds, Rash and Ulcer. HEENT Present- Seasonal Allergies and Wears glasses/contact lenses. Not Present- Earache, Hearing Loss, Hoarseness, Nose Bleed, Oral Ulcers, Ringing in the Ears, Sinus Pain, Sore Throat, Visual Disturbances and Yellow Eyes. Respiratory Not Present- Bloody sputum, Chronic Cough, Difficulty Breathing, Snoring and Wheezing. Breast Not Present- Breast Mass, Breast Pain, Nipple Discharge and Skin Changes. Cardiovascular Not Present- Chest Pain, Difficulty Breathing Lying Down, Leg Cramps, Palpitations, Rapid Heart Rate, Shortness of Breath and Swelling of Extremities. Gastrointestinal Present- Hemorrhoids and Rectal Pain. Not Present- Abdominal Pain, Bloating, Bloody Stool, Change in Bowel Habits, Chronic diarrhea, Constipation, Difficulty Swallowing, Excessive gas, Gets full quickly at meals, Indigestion, Nausea and Vomiting. Female Genitourinary Not Present- Frequency, Nocturia, Painful Urination, Pelvic Pain and Urgency. Musculoskeletal Present- Back Pain. Not Present- Joint Pain, Joint Stiffness, Muscle Pain, Muscle Weakness and Swelling of Extremities. Neurological Not Present- Decreased Memory, Fainting, Headaches, Numbness, Seizures, Tingling, Tremor, Trouble walking and Weakness. Psychiatric Not Present- Anxiety, Bipolar, Change in Sleep Pattern, Depression, Fearful and Frequent crying. Endocrine Not Present- Cold Intolerance, Excessive Hunger, Hair Changes, Heat Intolerance, Hot flashes and New Diabetes. Hematology Not Present- Blood  Thinners, Easy Bruising, Excessive bleeding, Gland problems, HIV and Persistent Infections.  Vitals [name] RMA; 12/02/2016 9:05 AM) 12/02/2016 9:05 AM Weight: 229 lb Height: 67.5in Body Surface Area: 2.15 m Body Mass Index: 35.34 kg/m  Temp.: 97.92F  Pulse: 81 (Regular)  BP: 140/80 (Sitting, Left Arm, Standard)      Physical Exam Alicia Ruff MD; 45/62/5638 9:30 AM)  General Mental Status-Alert. General Appearance-Not in acute  distress. Build & Nutrition-Well nourished. Posture-Normal posture. Gait-Normal.  Head and Neck Head-normocephalic, atraumatic with no lesions or palpable masses. Trachea-midline.  Chest and Lung Exam Chest and lung exam reveals -on auscultation, normal breath sounds, no adventitious sounds and normal vocal resonance.  Cardiovascular Cardiovascular examination reveals -normal heart sounds, regular rate and rhythm with no murmurs and no digital clubbing, cyanosis, edema, increased warmth or tenderness.  Abdomen Inspection Inspection of the abdomen reveals - No Hernias. Palpation/Percussion Palpation and Percussion of the abdomen reveal - Soft, Non Tender, No Rigidity (guarding), No hepatosplenomegaly and No Palpable abdominal masses.  Rectal Anorectal Exam External - skin tag. Internal - localized tenderness and decreased sphincter tone.  Neurologic Neurologic evaluation reveals -alert and oriented x 3 with no impairment of recent or remote memory, normal attention span and ability to concentrate, normal sensation and normal coordination.  Musculoskeletal Normal Exam - Bilateral-Upper Extremity Strength Normal and Lower Extremity Strength Normal.    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 She is having trouble with bleeding and pain on  a daily basis. On exam she has a very tender grade 4 internal hemorrhoid. I am unable to complete an anoscopic exam due to pain. I have recommended a complete hemorrhoidectomy. We have discussed this in detail including postoperative pain and bleeding, urinary retention, anal stenosis and fecal incontinence. I believe she understands these risk and has agreed to proceed with surgery.

## 2016-12-09 ENCOUNTER — Encounter (HOSPITAL_BASED_OUTPATIENT_CLINIC_OR_DEPARTMENT_OTHER): Payer: Self-pay | Admitting: *Deleted

## 2016-12-09 DIAGNOSIS — E669 Obesity, unspecified: Secondary | ICD-10-CM | POA: Diagnosis not present

## 2016-12-09 DIAGNOSIS — Z6834 Body mass index (BMI) 34.0-34.9, adult: Secondary | ICD-10-CM | POA: Diagnosis not present

## 2016-12-09 DIAGNOSIS — Z888 Allergy status to other drugs, medicaments and biological substances status: Secondary | ICD-10-CM | POA: Diagnosis not present

## 2016-12-09 DIAGNOSIS — M722 Plantar fascial fibromatosis: Secondary | ICD-10-CM | POA: Diagnosis not present

## 2016-12-09 DIAGNOSIS — M549 Dorsalgia, unspecified: Secondary | ICD-10-CM | POA: Diagnosis not present

## 2016-12-09 DIAGNOSIS — R7303 Prediabetes: Secondary | ICD-10-CM | POA: Diagnosis not present

## 2016-12-09 DIAGNOSIS — M1990 Unspecified osteoarthritis, unspecified site: Secondary | ICD-10-CM | POA: Diagnosis not present

## 2016-12-09 DIAGNOSIS — E7800 Pure hypercholesterolemia, unspecified: Secondary | ICD-10-CM | POA: Diagnosis not present

## 2016-12-09 DIAGNOSIS — I10 Essential (primary) hypertension: Secondary | ICD-10-CM | POA: Diagnosis not present

## 2016-12-09 DIAGNOSIS — Z79899 Other long term (current) drug therapy: Secondary | ICD-10-CM | POA: Diagnosis not present

## 2016-12-09 DIAGNOSIS — Z885 Allergy status to narcotic agent status: Secondary | ICD-10-CM | POA: Diagnosis not present

## 2016-12-09 DIAGNOSIS — I4510 Unspecified right bundle-branch block: Secondary | ICD-10-CM | POA: Diagnosis not present

## 2016-12-09 DIAGNOSIS — D649 Anemia, unspecified: Secondary | ICD-10-CM | POA: Diagnosis not present

## 2016-12-09 DIAGNOSIS — K642 Third degree hemorrhoids: Secondary | ICD-10-CM | POA: Diagnosis not present

## 2016-12-09 DIAGNOSIS — Z87891 Personal history of nicotine dependence: Secondary | ICD-10-CM | POA: Diagnosis not present

## 2016-12-09 DIAGNOSIS — Z7982 Long term (current) use of aspirin: Secondary | ICD-10-CM | POA: Diagnosis not present

## 2016-12-09 DIAGNOSIS — J449 Chronic obstructive pulmonary disease, unspecified: Secondary | ICD-10-CM | POA: Diagnosis not present

## 2016-12-09 DIAGNOSIS — Z981 Arthrodesis status: Secondary | ICD-10-CM | POA: Diagnosis not present

## 2016-12-09 DIAGNOSIS — K643 Fourth degree hemorrhoids: Secondary | ICD-10-CM | POA: Diagnosis present

## 2016-12-09 MED FILL — GABAPENTIN 300 MG CAPSULE: 300 | 90 days supply | Qty: 180 | Fill #0

## 2016-12-09 NOTE — Progress Notes (Addendum)
Npo after midnight arrive 730 am 12-17-16 [facility] take metoprolol succinate sip of water in am, husband bill driver ekg 8-36-[birth year] epic and on chart, lov dr Johnsie Cancel cardiology on chart 11-06-16 epic and on chart, echo 10-10-16 epic and on chart, cmet, lipid panel, and hemaglbin a1c 11-18-16 eale on chart. needs hemaglobin

## 2016-12-12 ENCOUNTER — Ambulatory Visit (INDEPENDENT_AMBULATORY_CARE_PROVIDER_SITE_OTHER)
Admission: RE | Admit: 2016-12-12 | Discharge: 2016-12-12 | Disposition: A | Discharge status: Home / Self Care | Payer: 59 | Source: Ambulatory Visit | Attending: Acute Care | Admitting: Acute Care

## 2016-12-12 DIAGNOSIS — Z87891 Personal history of nicotine dependence: Secondary | ICD-10-CM | POA: Diagnosis not present

## 2016-12-16 ENCOUNTER — Other Ambulatory Visit: Payer: Self-pay | Admitting: Acute Care

## 2016-12-16 DIAGNOSIS — Z122 Encounter for screening for malignant neoplasm of respiratory organs: Secondary | ICD-10-CM

## 2016-12-16 DIAGNOSIS — Z87891 Personal history of nicotine dependence: Secondary | ICD-10-CM

## 2016-12-17 ENCOUNTER — Encounter (HOSPITAL_BASED_OUTPATIENT_CLINIC_OR_DEPARTMENT_OTHER): Payer: Self-pay | Admitting: Anesthesiology

## 2016-12-17 ENCOUNTER — Encounter (HOSPITAL_BASED_OUTPATIENT_CLINIC_OR_DEPARTMENT_OTHER)
Admission: RE | Disposition: A | Discharge status: Home / Self Care | Payer: Self-pay | Source: Ambulatory Visit | Attending: General Surgery

## 2016-12-17 ENCOUNTER — Ambulatory Visit (HOSPITAL_BASED_OUTPATIENT_CLINIC_OR_DEPARTMENT_OTHER)
Admission: RE | Admit: 2016-12-17 | Discharge: 2016-12-17 | Disposition: A | Discharge status: Home / Self Care | Payer: 59 | Source: Ambulatory Visit | Attending: General Surgery | Admitting: General Surgery

## 2016-12-17 ENCOUNTER — Ambulatory Visit (HOSPITAL_BASED_OUTPATIENT_CLINIC_OR_DEPARTMENT_OTHER): Payer: 59 | Admitting: Anesthesiology

## 2016-12-17 ENCOUNTER — Other Ambulatory Visit: Payer: Self-pay

## 2016-12-17 DIAGNOSIS — K642 Third degree hemorrhoids: Secondary | ICD-10-CM | POA: Diagnosis not present

## 2016-12-17 DIAGNOSIS — K643 Fourth degree hemorrhoids: Secondary | ICD-10-CM | POA: Diagnosis not present

## 2016-12-17 DIAGNOSIS — M549 Dorsalgia, unspecified: Secondary | ICD-10-CM | POA: Insufficient documentation

## 2016-12-17 DIAGNOSIS — Z6834 Body mass index (BMI) 34.0-34.9, adult: Secondary | ICD-10-CM | POA: Insufficient documentation

## 2016-12-17 DIAGNOSIS — E7800 Pure hypercholesterolemia, unspecified: Secondary | ICD-10-CM | POA: Insufficient documentation

## 2016-12-17 DIAGNOSIS — Z888 Allergy status to other drugs, medicaments and biological substances status: Secondary | ICD-10-CM | POA: Diagnosis not present

## 2016-12-17 DIAGNOSIS — Z7982 Long term (current) use of aspirin: Secondary | ICD-10-CM | POA: Diagnosis not present

## 2016-12-17 DIAGNOSIS — Z79899 Other long term (current) drug therapy: Secondary | ICD-10-CM | POA: Diagnosis not present

## 2016-12-17 DIAGNOSIS — E669 Obesity, unspecified: Secondary | ICD-10-CM | POA: Insufficient documentation

## 2016-12-17 DIAGNOSIS — I4510 Unspecified right bundle-branch block: Secondary | ICD-10-CM | POA: Insufficient documentation

## 2016-12-17 DIAGNOSIS — M1990 Unspecified osteoarthritis, unspecified site: Secondary | ICD-10-CM | POA: Insufficient documentation

## 2016-12-17 DIAGNOSIS — Z885 Allergy status to narcotic agent status: Secondary | ICD-10-CM | POA: Diagnosis not present

## 2016-12-17 DIAGNOSIS — M722 Plantar fascial fibromatosis: Secondary | ICD-10-CM | POA: Diagnosis not present

## 2016-12-17 DIAGNOSIS — I10 Essential (primary) hypertension: Secondary | ICD-10-CM | POA: Insufficient documentation

## 2016-12-17 DIAGNOSIS — R7303 Prediabetes: Secondary | ICD-10-CM | POA: Insufficient documentation

## 2016-12-17 DIAGNOSIS — Z981 Arthrodesis status: Secondary | ICD-10-CM | POA: Insufficient documentation

## 2016-12-17 DIAGNOSIS — Z87891 Personal history of nicotine dependence: Secondary | ICD-10-CM | POA: Insufficient documentation

## 2016-12-17 DIAGNOSIS — J449 Chronic obstructive pulmonary disease, unspecified: Secondary | ICD-10-CM | POA: Insufficient documentation

## 2016-12-17 DIAGNOSIS — K649 Unspecified hemorrhoids: Secondary | ICD-10-CM | POA: Diagnosis not present

## 2016-12-17 DIAGNOSIS — D649 Anemia, unspecified: Secondary | ICD-10-CM | POA: Insufficient documentation

## 2016-12-17 HISTORY — DX: Malignant (primary) neoplasm, unspecified: C80.1

## 2016-12-17 HISTORY — PX: HEMORRHOID SURGERY: SHX153

## 2016-12-17 LAB — POCT HEMOGLOBIN-HEMACUE: HEMOGLOBIN: 13.4 g/dL (ref 12.0–15.0)

## 2016-12-17 SURGERY — HEMORRHOIDECTOMY
Anesthesia: Monitor Anesthesia Care

## 2016-12-17 MED ORDER — DEXAMETHASONE SODIUM PHOSPHATE 10 MG/ML IJ SOLN
INTRAMUSCULAR | Status: AC
  Filled 2016-12-17: qty 1

## 2016-12-17 MED ORDER — DEXAMETHASONE SODIUM PHOSPHATE 4 MG/ML IJ SOLN
INTRAMUSCULAR | Status: DC | PRN
  Administered 2016-12-17: 10 mg via INTRAVENOUS

## 2016-12-17 MED ORDER — SODIUM CHLORIDE 0.9% FLUSH
3.0000 mL | Freq: Two times a day (BID) | INTRAVENOUS | Status: DC
  Filled 2016-12-17: qty 3

## 2016-12-17 MED ORDER — ACETAMINOPHEN 500 MG PO TABS
ORAL_TABLET | ORAL | Status: AC
  Filled 2016-12-17: qty 2

## 2016-12-17 MED ORDER — FENTANYL CITRATE (PF) 100 MCG/2ML IJ SOLN
INTRAMUSCULAR | Status: AC
  Filled 2016-12-17: qty 2

## 2016-12-17 MED ORDER — LACTATED RINGERS IV SOLN
INTRAVENOUS | Status: DC
  Administered 2016-12-17 (×2): via INTRAVENOUS
  Filled 2016-12-17: qty 1000

## 2016-12-17 MED ORDER — MIDAZOLAM HCL 5 MG/5ML IJ SOLN
INTRAMUSCULAR | Status: DC | PRN
  Administered 2016-12-17 (×2): 2 mg via INTRAVENOUS

## 2016-12-17 MED ORDER — BUPIVACAINE-EPINEPHRINE 0.5% -1:200000 IJ SOLN
INTRAMUSCULAR | Status: DC | PRN
  Administered 2016-12-17: 30 mL

## 2016-12-17 MED ORDER — ONDANSETRON HCL 4 MG/2ML IJ SOLN
INTRAMUSCULAR | Status: DC | PRN
  Administered 2016-12-17: 4 mg via INTRAVENOUS

## 2016-12-17 MED ORDER — FENTANYL CITRATE (PF) 100 MCG/2ML IJ SOLN
INTRAMUSCULAR | Status: DC | PRN
  Administered 2016-12-17: 25 ug via INTRAVENOUS

## 2016-12-17 MED ORDER — BUPIVACAINE LIPOSOME 1.3 % IJ SUSP
20.0000 mL | INTRAMUSCULAR | Status: DC
  Filled 2016-12-17: qty 20

## 2016-12-17 MED ORDER — DIAZEPAM 5 MG PO TABS: 5.0000 mg | ORAL_TABLET | Freq: Four times a day (QID) | ORAL | 0 refills | Status: AC | PRN

## 2016-12-17 MED ORDER — PROPOFOL 500 MG/50ML IV EMUL
INTRAVENOUS | Status: AC
  Filled 2016-12-17: qty 50

## 2016-12-17 MED ORDER — GABAPENTIN 300 MG PO CAPS
300.0000 mg | ORAL_CAPSULE | ORAL | Status: AC
  Administered 2016-12-17: 300 mg via ORAL
  Filled 2016-12-17: qty 1

## 2016-12-17 MED ORDER — ONDANSETRON HCL 4 MG/2ML IJ SOLN
INTRAMUSCULAR | Status: AC
  Filled 2016-12-17: qty 2

## 2016-12-17 MED ORDER — HYDROCODONE-ACETAMINOPHEN 5-325 MG PO TABS: 1.0000 | ORAL_TABLET | Freq: Four times a day (QID) | ORAL | 0 refills | Status: DC | PRN

## 2016-12-17 MED ORDER — SODIUM CHLORIDE 0.9 % IV SOLN
250.0000 mL | INTRAVENOUS | Status: DC | PRN
  Filled 2016-12-17: qty 250

## 2016-12-17 MED ORDER — MIDAZOLAM HCL 2 MG/2ML IJ SOLN
INTRAMUSCULAR | Status: AC
  Filled 2016-12-17: qty 2

## 2016-12-17 MED ORDER — ACETAMINOPHEN 650 MG RE SUPP
650.0000 mg | RECTAL | Status: DC | PRN
  Filled 2016-12-17: qty 1

## 2016-12-17 MED ORDER — FENTANYL CITRATE (PF) 100 MCG/2ML IJ SOLN
25.0000 ug | INTRAMUSCULAR | Status: DC | PRN
  Filled 2016-12-17: qty 1

## 2016-12-17 MED ORDER — ACETAMINOPHEN 325 MG PO TABS
650.0000 mg | ORAL_TABLET | ORAL | Status: DC | PRN
Start: 2016-12-17 — End: 2016-12-17
  Filled 2016-12-17: qty 2

## 2016-12-17 MED ORDER — PROPOFOL 500 MG/50ML IV EMUL
INTRAVENOUS | Status: DC | PRN
  Administered 2016-12-17: 75 ug/kg/min via INTRAVENOUS

## 2016-12-17 MED ORDER — HYDROCODONE-ACETAMINOPHEN 7.5-325 MG PO TABS
1.0000 | ORAL_TABLET | Freq: Once | ORAL | Status: DC | PRN
  Filled 2016-12-17: qty 1

## 2016-12-17 MED ORDER — BUPIVACAINE LIPOSOME 1.3 % IJ SUSP
INTRAMUSCULAR | Status: DC | PRN
  Administered 2016-12-17: 20 mL

## 2016-12-17 MED ORDER — PROPOFOL 10 MG/ML IV BOLUS
INTRAVENOUS | Status: DC | PRN
  Administered 2016-12-17 (×2): 40 mg via INTRAVENOUS

## 2016-12-17 MED ORDER — LIDOCAINE HCL (CARDIAC) 20 MG/ML IV SOLN
INTRAVENOUS | Status: DC | PRN
  Administered 2016-12-17: 40 mg via INTRAVENOUS

## 2016-12-17 MED ORDER — LIDOCAINE 2% (20 MG/ML) 5 ML SYRINGE
INTRAMUSCULAR | Status: AC
  Filled 2016-12-17: qty 5

## 2016-12-17 MED ORDER — ACETAMINOPHEN 500 MG PO TABS
1000.0000 mg | ORAL_TABLET | ORAL | Status: AC
  Administered 2016-12-17: 1000 mg via ORAL
  Filled 2016-12-17: qty 2

## 2016-12-17 MED ORDER — GABAPENTIN 300 MG PO CAPS
ORAL_CAPSULE | ORAL | Status: AC
  Filled 2016-12-17: qty 1

## 2016-12-17 MED ORDER — MEPERIDINE HCL 25 MG/ML IJ SOLN
6.2500 mg | INTRAMUSCULAR | Status: DC | PRN
  Filled 2016-12-17: qty 1

## 2016-12-17 MED ORDER — ONDANSETRON HCL 4 MG/2ML IJ SOLN
4.0000 mg | Freq: Once | INTRAMUSCULAR | Status: DC | PRN
  Filled 2016-12-17: qty 2

## 2016-12-17 MED ORDER — SODIUM CHLORIDE 0.9% FLUSH
3.0000 mL | INTRAVENOUS | Status: DC | PRN
  Filled 2016-12-17: qty 3

## 2016-12-17 MED FILL — diazePAM 5 MG TABS: 5 | 3 days supply | Qty: 10 | Fill #0

## 2016-12-17 MED FILL — HYDROCODON-APAP 5-325: 5-325 | 5 days supply | Qty: 40 | Fill #0

## 2016-12-17 SURGICAL SUPPLY — 40 items
BLADE HEX COATED 2.75 (ELECTRODE) ×3 IMPLANT
BLADE SURG 10 STRL SS (BLADE) ×3 IMPLANT
BRIEF STRETCH FOR OB PAD LRG (UNDERPADS AND DIAPERS) ×2 IMPLANT
COVER BACK TABLE 60X90IN (DRAPES) ×3 IMPLANT
COVER MAYO STAND STRL (DRAPES) ×3 IMPLANT
DRAPE LAPAROTOMY 100X72 PEDS (DRAPES) ×3 IMPLANT
DRAPE UTILITY XL STRL (DRAPES) ×3 IMPLANT
ELECT BLADE 6.5 .24CM SHAFT (ELECTRODE) IMPLANT
ELECT REM PT RETURN 9FT ADLT (ELECTROSURGICAL) ×3
ELECTRODE REM PT RTRN 9FT ADLT (ELECTROSURGICAL) ×1 IMPLANT
GAUZE SPONGE 4X4 16PLY XRAY LF (GAUZE/BANDAGES/DRESSINGS) ×2 IMPLANT
GAUZE SPONGE 4X4 8PLY STR LF (GAUZE/BANDAGES/DRESSINGS) ×2 IMPLANT
GLOVE BIO SURGEON STRL SZ 6.5 (GLOVE) ×2 IMPLANT
GLOVE BIO SURGEONS STRL SZ 6.5 (GLOVE) ×1
GLOVE INDICATOR 7.0 STRL GRN (GLOVE) ×3 IMPLANT
GOWN SPEC L3 XXLG W/TWL (GOWN DISPOSABLE) ×4 IMPLANT
KIT RM TURNOVER CYSTO AR (KITS) ×3 IMPLANT
NEEDLE HYPO 22GX1.5 SAFETY (NEEDLE) ×3 IMPLANT
NS IRRIG 500ML POUR BTL (IV SOLUTION) ×3 IMPLANT
PACK BASIN DAY SURGERY FS (CUSTOM PROCEDURE TRAY) ×3 IMPLANT
PAD ABD 8X10 STRL (GAUZE/BANDAGES/DRESSINGS) ×3 IMPLANT
PAD ARMBOARD 7.5X6 YLW CONV (MISCELLANEOUS) ×2 IMPLANT
PENCIL BUTTON HOLSTER BLD 10FT (ELECTRODE) ×3 IMPLANT
SPONGE GAUZE 4X4 12PLY (GAUZE/BANDAGES/DRESSINGS) ×3 IMPLANT
SPONGE SURGIFOAM ABS GEL 100 (HEMOSTASIS) IMPLANT
SPONGE SURGIFOAM ABS GEL 12-7 (HEMOSTASIS) IMPLANT
SUT CHROMIC 2 0 SH (SUTURE) ×8 IMPLANT
SUT CHROMIC 3 0 SH 27 (SUTURE) ×8 IMPLANT
SUT PROLENE 2 0 BLUE (SUTURE) IMPLANT
SUT VIC AB 2-0 SH 27 (SUTURE)
SUT VIC AB 2-0 SH 27XBRD (SUTURE) IMPLANT
SUT VIC AB 4-0 P-3 18XBRD (SUTURE) IMPLANT
SUT VIC AB 4-0 P3 18 (SUTURE)
SUT VIC AB 4-0 SH 18 (SUTURE) IMPLANT
SYR CONTROL 10ML LL (SYRINGE) ×3 IMPLANT
TRAY DSU PREP LF (CUSTOM PROCEDURE TRAY) ×3 IMPLANT
TUBE CONNECTING 12'X1/4 (SUCTIONS) ×1
TUBE CONNECTING 12X1/4 (SUCTIONS) ×2 IMPLANT
WATER STERILE IRR 500ML POUR (IV SOLUTION) IMPLANT
YANKAUER SUCT BULB TIP NO VENT (SUCTIONS) ×3 IMPLANT

## 2016-12-17 NOTE — Anesthesia Preprocedure Evaluation (Addendum)
Anesthesia Evaluation  Patient identified by MRN, date of birth, ID band Patient awake    Reviewed: Allergy & Precautions, NPO status , Patient's Chart, lab work & pertinent test results, reviewed documented beta blocker date and time   Airway Mallampati: II  TM Distance: >3 FB Neck ROM: Full    Dental  (+) Teeth Intact, Caps,    Pulmonary COPD, former smoker,    Pulmonary exam normal breath sounds clear to auscultation       Cardiovascular hypertension, Pt. on medications and Pt. on home beta blockers Normal cardiovascular exam+ dysrhythmias  Rhythm:Regular Rate:Normal  Hx/o RBBB   Neuro/Psych Bilateral plantar fascitis  Neuromuscular disease negative psych ROS   GI/Hepatic Neg liver ROS, GERD  Controlled and Medicated,Hemorrhoids   Endo/Other  Hyperlipidemia Borderline DM- last HbA1c 6.1  Renal/GU negative Renal ROS  negative genitourinary   Musculoskeletal  (+) Arthritis , Osteoarthritis,  Hx/o lumbar fusion   Abdominal (+) + obese,   Peds  Hematology  (+) anemia ,   Anesthesia Other Findings   Reproductive/Obstetrics                            Anesthesia Physical Anesthesia Plan  ASA: II  Anesthesia Plan: MAC   Post-op Pain Management:    Induction:   PONV Risk Score and Plan: 2 and Propofol infusion, Ondansetron and Metaclopromide  Airway Management Planned: Natural Airway, Simple Face Mask and Nasal Cannula  Additional Equipment:   Intra-op Plan:   Post-operative Plan:   Informed Consent: I have reviewed the patients History and Physical, chart, labs and discussed the procedure including the risks, benefits and alternatives for the proposed anesthesia with the patient or authorized representative who has indicated his/her understanding and acceptance.   Dental advisory given  Plan Discussed with: Anesthesiologist, CRNA and Surgeon  Anesthesia Plan Comments:          Anesthesia Quick Evaluation

## 2016-12-17 NOTE — Op Note (Addendum)
12/17/2016  10:28 AM  PATIENT:  Alicia Hogan  66 y.o. female  Patient Care Team: Maurice Small, MD as PCP - General (Family Medicine)  PRE-OPERATIVE DIAGNOSIS:  GRADE 4 HEMORRHOIDS  POST-OPERATIVE DIAGNOSIS:  GRADE 3 HEMORRHOIDS  PROCEDURE:  3 COLUMN HEMORRHOIDECTOMY    Surgeon(s): Leighton Ruff, MD  ASSISTANT: none   ANESTHESIA:   local and MAC  SPECIMEN:  Source of Specimen:  R anterior, R posterior and L lateral internal hemorrhoid  DISPOSITION OF SPECIMEN:  PATHOLOGY  COUNTS:  YES  PLAN OF CARE: Discharge to home after PACU  PATIENT DISPOSITION:  PACU - hemodynamically stable.  INDICATION: 66 year old female who presents to the office with grade 4 internal hemorrhoids.  We discussed her only real option would be 3 column hemorrhoidectomy.  We discussed that this is a difficult operation to overcome but that it most likely will take care of most if not all of her complaints.  We discussed pain control after surgery in detail.   OR FINDINGS: Grade 3 internal hemorrhoids at all 3 positions  DESCRIPTION: the patient was identified in the preoperative holding area and taken to the OR where they were laid on the operating room table.  MAC anesthesia was induced without difficulty. The patient was then positioned in prone jackknife position with buttocks gently taped apart.  The patient was then prepped and draped in usual sterile fashion.  SCDs were noted to be in place prior to the initiation of anesthesia. A surgical timeout was performed indicating the correct patient, procedure, positioning and need for preoperative antibiotics.  A rectal block was performed using Marcaine with epinephrine mixed with Experel.    I began with a digital rectal exam.  There were no masses.  Sphincter tone was slightly decreased.  I then placed a Hill-Ferguson anoscope into the anal canal and evaluated this completely.  The patient had a large right anterior and right posterior grade 3  hemorrhoid.  There was a smaller left lateral grade 3 internal hemorrhoid.  All 3 of these had an associated external component.  I began with the right internal hemorrhoid.  This was elevated off of the sphincter complex using an Allis clamp.  Metzenbaum scissors were then used to dissect the hemorrhoid tissue away from the anal sphincter bluntly.  The edges were then incised with the scissors down to the level of normal rectal mucosa.  This was sent to pathology as right anterior hemorrhoid.  The internal portion was closed using a 2-0 running chromic suture.  This was closed to the dentate line.  An additional 2-0 chromic interrupted suture was used for added hemostasis.  The anoderm distal to the dentate line was closed with a running 3-0 chromic suture. The right posterior hemorrhoid was dissected in similar fashion.  It was also closed with a 2-0 chromic running suture internally and a 3-0 running chromic suture externally.  I then turned my attention to the left lateral anal canal.  This was a small hemorrhoid but it was excised in similar fashion.  It was also closed using a 2-0 chromic suture to the dentate line and a 3-0 chromic running suture distal to the dentate line.  An additional 3-0 chromic interrupted suture was used to close mucosa at the dentate line.  Once this was complete, there was no sign of additional hemorrhoid disease.  The anal canal was easily patent to approximately 2 fingerbreadths.    Hemostasis was good.  A dressing was applied.  The patient  was awakened from anesthesia and sent to the postanesthesia care unit in stable condition.  All counts were correct per operating room staff.  I have reviewed the [employer].  The patient does have recent but no active prescriptions for narcotics

## 2016-12-17 NOTE — Transfer of Care (Signed)
Immediate Anesthesia Transfer of Care Note  Patient: Alicia Hogan  Procedure(s) Performed: 3 COLUMN HEMORRHOIDECTOMY (N/A )  Patient Location: PACU  Anesthesia Type:MAC  Level of Consciousness: awake, alert  and oriented  Airway & Oxygen Therapy: Patient Spontanous Breathing and Patient connected to nasal cannula oxygen  Post-op Assessment: Report given to RN  Post vital signs: Reviewed and stable  Last Vitals: 124/76, 80 Vitals:   12/17/16 0730 12/17/16 1034  BP: (!) 147/78   Pulse: 78   Resp: 18 (P) 14  Temp: 36.4 C (!) (P) 36.4 C  SpO2: 100% (P) 100%    Last Pain:  Vitals:   12/17/16 0730  TempSrc: Oral      Patients Stated Pain Goal: 4 (75/91/63 8466)  Complications: No apparent anesthesia complications

## 2016-12-17 NOTE — Interval H&P Note (Signed)
History and Physical Interval Note:  12/17/2016 8:15 AM  Alicia Hogan  has presented today for surgery, with the diagnosis of GRADE 4 HEMS  The various methods of treatment have been discussed with the patient and family. After consideration of risks, benefits and other options for treatment, the patient has consented to  Procedure(s): HEMORRHOIDECTOMY (N/A) as a surgical intervention .  The patient's history has been reviewed, patient examined, no change in status, stable for surgery.  I have reviewed the patient's chart and labs.  Questions were answered to the patient's satisfaction.     Rosario Adie, MD  [facility]

## 2016-12-17 NOTE — Anesthesia Procedure Notes (Signed)
Procedure Name: MAC Date/Time: 12/17/2016 9:37 AM Performed by: Bonney Aid, CRNA Pre-anesthesia Checklist: Patient identified, Timeout performed, Emergency Drugs available, Suction available and Patient being monitored Patient Re-evaluated:Patient Re-evaluated prior to induction Oxygen Delivery Method: Nasal cannula Placement Confirmation: positive ETCO2

## 2016-12-17 NOTE — Discharge Instructions (Addendum)
Post Anesthesia Home Care Instructions  Activity: Get plenty of rest for the remainder of the day. A responsible individual must stay with you for 24 hours following the procedure.  For the next 24 hours, DO NOT: -Drive a car -Paediatric nurse -Drink alcoholic beverages -Take any medication unless instructed by your physician -Make any legal decisions or sign important papers.  Meals: Start with liquid foods such as gelatin or soup. Progress to regular foods as tolerated. Avoid greasy, spicy, heavy foods. If nausea and/or vomiting occur, drink only clear liquids until the nausea and/or vomiting subsides. Call your physician if vomiting continues.  Special Instructions/Symptoms: Your throat may feel dry or sore from the anesthesia or the breathing tube placed in your throat during surgery. If this causes discomfort, gargle with warm salt water. The discomfort should disappear within 24 hours.  If you had a scopolamine patch placed behind your ear for the management of post- operative nausea and/or vomiting:  1. The medication in the patch is effective for 72 hours, after which it should be removed.  Wrap patch in a tissue and discard in the trash. Wash hands thoroughly with soap and water. 2. You may remove the patch earlier than 72 hours if you experience unpleasant side effects which may include dry mouth, dizziness or visual disturbances. 3. Avoid touching the patch. Wash your hands with soap and water after contact with the patch.   ANORECTAL SURGERY: POST OP INSTRUCTIONS 1. Take your usually prescribed home medications unless otherwise directed. 2. DIET: During the first few hours after surgery sip on some liquids until you are able to urinate.  It is normal to not urinate for several hours after this surgery.  If you feel uncomfortable, please contact the office for instructions.  After you are able to urinate,you may eat, if you feel like it.  Follow a light bland diet the first 24  hours after arrival home, such as soup, liquids, crackers, etc.  Be sure to include lots of fluids daily (6-8 glasses).  Avoid fast food or heavy meals, as your are more likely to get nauseated.  Eat a low fat diet the next few days after surgery.  Limit caffeine intake to 1-2 servings a day. 3. PAIN CONTROL: a. Pain is best controlled by a usual combination of several different methods TOGETHER: i. Muscle relaxation 1.  Soak in a warm bath (or Sitz bath) three times a day and after bowel movements.  Continue to do this until all pain is resolved. 2. Take the muscle relaxer (Valium) every 6 hours for the first 2 days after surgery  ii. Over the counter pain medication iii. Prescription pain medication b. Most patients will experience some swelling and discomfort in the anus/rectal area and incisions.  Heat such as warm towels, sitz baths, warm baths, etc to help relax tight/sore spots and speed recovery.  Some people prefer to use ice, especially in the first couple days after surgery, as it may decrease the pain and swelling, or alternate between ice & heat.  Experiment to what works for you.  Swelling and bruising can take several weeks to resolve.  Pain can take even longer to completely resolve. c. It is helpful to take an over-the-counter pain medication regularly for the first few weeks.  Choose one of the following that works best for you: i. Naproxen (Aleve, etc)  Two 220mg  tabs twice a day ii. Ibuprofen (Advil, etc) Three 200mg  tabs four times a day (every meal & bedtime)  d. A  prescription for pain medication (such as percocet, oxycodone, hydrocodone, etc) should be given to you upon discharge.  Take your pain medication as prescribed.  i. If you are having problems/concerns with the prescription medicine (does not control pain, nausea, vomiting, rash, itching, etc), please call us [phone] to see if we need to switch you to a different pain medicine that will work better for you and/or  control your side effect better. ii. If you need a refill on your pain medication, please contact your pharmacy.  They will contact our office to request authorization. Prescriptions will not be filled after 5 pm or on week-ends. 4. KEEP YOUR BOWELS REGULAR and AVOID CONSTIPATION a. The goal is one to two soft bowel movements a day.  You should at least have a bowel movement every other day. b. Avoid getting constipated.  Between the surgery and the pain medications, it is common to experience some constipation. This can be very painful after rectal surgery.  Increasing fluid intake and taking a fiber supplement (such as Metamucil, Citrucel, FiberCon, etc) 1-2 times a day regularly will usually help prevent this problem from occurring.  A stool softener like colace is also recommended.  This can be purchased over the counter at your pharmacy.  You can take it up to 3 times a day.  If you do not have a bowel movement after 24 hrs since your surgery, take one does of milk of magnesia.  If you still haven't had a bowel movement 8-12 hours after that dose, take another dose.  If you don't have a bowel movement 48 hrs after surgery, purchase a Fleets enema from the drug store and administer gently per package instructions.  If you still are having trouble with your bowel movements after that, please call the office for further instructions. c. If you develop diarrhea or have many loose bowel movements, simplify your diet to bland foods & liquids for a few days.  Stop any stool softeners and decrease your fiber supplement.  Switching to mild anti-diarrheal medications (Kayopectate, Pepto Bismol) can help.  If this worsens or does not improve, please call us.  5. Wound Care a. Remove your bandages before your first bowel movement or 8 hours after surgery.     b. Remove any wound packing material at this tim,e as well.  You do not need to repack the wound unless instructed otherwise.  Wear an absorbent pad or soft  cotton gauze in your underwear to catch any drainage and help keep the area clean. You should change this every 2-3 hours while awake. c. Keep the area clean and dry.  Bathe / shower every day, especially after bowel movements.  Keep the area clean by showering / bathing over the incision / wound.   It is okay to soak an open wound to help wash it.  Wet wipes or showers / gentle washing after bowel movements is often less traumatic than regular toilet paper. d. Dennis Bast may have some styrofoam-like soft packing in the rectum which will come out with the first bowel movement.  e. You will often notice bleeding with bowel movements.  This should slow down by the end of the first week of surgery f. Expect some drainage.  This should slow down, too, by the end of the first week of surgery.  Wear an absorbent pad or soft cotton gauze in your underwear until the drainage stops. g. Do Not sit on a rubber or pillow ring.  This can make you symptoms worse.  You may sit on a soft pillow if needed.  6. ACTIVITIES as tolerated:   a. You may resume regular (light) daily activities beginning the next day--such as daily self-care, walking, climbing stairs--gradually increasing activities as tolerated.  If you can walk 30 minutes without difficulty, it is safe to try more intense activity such as jogging, treadmill, bicycling, low-impact aerobics, swimming, etc. b. Save the most intensive and strenuous activity for last such as sit-ups, heavy lifting, contact sports, etc  Refrain from any heavy lifting or straining until you are off narcotics for pain control.   c. You may drive when you are no longer taking prescription pain medication, you can comfortably sit for long periods of time, and you can safely maneuver your car and apply brakes. d. Dennis Bast may have sexual intercourse when it is comfortable.  7. FOLLOW UP in our office a. Please call [facility] at [phone] to set up an appointment to see your surgeon in the office for  a follow-up appointment approximately 3-4 weeks after your surgery. b. Make sure that you call for this appointment the day you arrive home to insure a convenient appointment time. 10. IF YOU HAVE DISABILITY OR FAMILY LEAVE FORMS, BRING THEM TO THE OFFICE FOR PROCESSING.  DO NOT GIVE THEM TO YOUR DOCTOR.     WHEN TO CALL us [phone]: 1. Poor pain control 2. Reactions / problems with new medications (rash/itching, nausea, etc)  3. Fever over 101.5 F (38.5 C) 4. Inability to urinate 5. Nausea and/or vomiting 6. Worsening swelling or bruising 7. Continued bleeding from incision. 8. Increased pain, redness, or drainage from the incision  The clinic staff is available to answer your questions during regular business hours (8:30am-5pm).  Please dont hesitate to call and ask to speak to one of our nurses for clinical concerns.   A surgeon from [facility] is always on call at the hospitals   If you have a medical emergency, go to the nearest emergency room or call 911.    [facility], [place], [place], [place], [place]  [address] ? MAIN: [phone] ? TOLL FREE: [phone] ? FAX [phone] [email or web address]

## 2016-12-17 NOTE — Anesthesia Postprocedure Evaluation (Signed)
Anesthesia Post Note  Patient: Alicia Hogan  Procedure(s) Performed: 3 COLUMN HEMORRHOIDECTOMY (N/A )     Patient location during evaluation: PACU Anesthesia Type: MAC Level of consciousness: awake and alert and oriented Pain management: pain level controlled Vital Signs Assessment: post-procedure vital signs reviewed and stable Respiratory status: spontaneous breathing, nonlabored ventilation and respiratory function stable Cardiovascular status: stable and blood pressure returned to baseline Postop Assessment: no apparent nausea or vomiting Anesthetic complications: no    Last Vitals:  Vitals:   12/17/16 1100 12/17/16 1115  BP: 106/69 129/73  Pulse: 72 70  Resp: 14 15  Temp:    SpO2: 100% 99%    Last Pain:  Vitals:   12/17/16 0730  TempSrc: Oral                 Amandeep Hogston A.

## 2016-12-18 ENCOUNTER — Encounter (HOSPITAL_BASED_OUTPATIENT_CLINIC_OR_DEPARTMENT_OTHER): Payer: Self-pay | Admitting: General Surgery

## 2017-01-05 MED FILL — LOSARTAN POTASSIUM 100 MG T: 100 | 90 days supply | Qty: 90 | Fill #2

## 2017-01-13 DIAGNOSIS — E2839 Other primary ovarian failure: Secondary | ICD-10-CM | POA: Diagnosis not present

## 2017-01-14 MED FILL — SHINGRIX VIAL KIT: 50 | 1 days supply | Qty: 1 | Fill #0

## 2017-01-21 ENCOUNTER — Telehealth: Payer: Self-pay | Admitting: Podiatry

## 2017-01-21 DIAGNOSIS — M7750 Other enthesopathy of unspecified foot: Secondary | ICD-10-CM | POA: Diagnosis not present

## 2017-01-21 MED FILL — METOPROLOL SUCC ER 25 MG TA: 25 | 90 days supply | Qty: 90 | Fill #2

## 2017-01-21 NOTE — Telephone Encounter (Signed)
Pt called and would like a second pair of orthotics order but would like to use the mold from 2015 not the current one. She states the new ones don't work as well. Pt asked if we could go ahead and bill insurance now before the end of the year and is aware they may not cover them due to the max allowable for dme... She will come in to see Liliane Channel when they come in to pick the 2nd pair up and see if Liliane Channel can adjust the new ones from 2018 to fit better.Marland Kitchen

## 2017-01-29 ENCOUNTER — Other Ambulatory Visit (HOSPITAL_COMMUNITY): Payer: Self-pay | Admitting: Neurological Surgery

## 2017-01-29 DIAGNOSIS — M5416 Radiculopathy, lumbar region: Secondary | ICD-10-CM

## 2017-02-02 MED FILL — HYDROCHLOROTHIAZIDE 25 MG T: 25 | 90 days supply | Qty: 90 | Fill #5

## 2017-02-09 ENCOUNTER — Ambulatory Visit (HOSPITAL_COMMUNITY)
Admission: RE | Admit: 2017-02-09 | Discharge: 2017-02-09 | Disposition: A | Discharge status: Home / Self Care | Payer: 59 | Source: Ambulatory Visit | Attending: Neurological Surgery | Admitting: Neurological Surgery

## 2017-02-09 DIAGNOSIS — M5416 Radiculopathy, lumbar region: Secondary | ICD-10-CM | POA: Diagnosis not present

## 2017-02-09 DIAGNOSIS — M48061 Spinal stenosis, lumbar region without neurogenic claudication: Secondary | ICD-10-CM | POA: Insufficient documentation

## 2017-02-09 DIAGNOSIS — Z981 Arthrodesis status: Secondary | ICD-10-CM | POA: Insufficient documentation

## 2017-02-09 DIAGNOSIS — M4316 Spondylolisthesis, lumbar region: Secondary | ICD-10-CM | POA: Insufficient documentation

## 2017-02-11 DIAGNOSIS — L821 Other seborrheic keratosis: Secondary | ICD-10-CM | POA: Diagnosis not present

## 2017-02-11 DIAGNOSIS — Z23 Encounter for immunization: Secondary | ICD-10-CM | POA: Diagnosis not present

## 2017-02-11 DIAGNOSIS — Z808 Family history of malignant neoplasm of other organs or systems: Secondary | ICD-10-CM | POA: Diagnosis not present

## 2017-02-11 DIAGNOSIS — D225 Melanocytic nevi of trunk: Secondary | ICD-10-CM | POA: Diagnosis not present

## 2017-02-11 NOTE — Telephone Encounter (Signed)
Orthotics are ordered based upon 2015 mold.

## 2017-02-23 MED FILL — LOVASTATIN 40 MG TABS: 40 | 90 days supply | Qty: 45 | Fill #0

## 2017-03-09 DIAGNOSIS — H43811 Vitreous degeneration, right eye: Secondary | ICD-10-CM | POA: Diagnosis not present

## 2017-03-16 MED FILL — GABAPENTIN 300 MG CAPSULE: 300 | 90 days supply | Qty: 180 | Fill #1

## 2017-03-20 MED FILL — SHINGRIX VIAL KIT: 50 | 1 days supply | Qty: 1 | Fill #1

## 2017-04-01 DIAGNOSIS — H52203 Unspecified astigmatism, bilateral: Secondary | ICD-10-CM | POA: Diagnosis not present

## 2017-04-20 MED FILL — LOSARTAN POTASSIUM 100 MG T: 100 | 90 days supply | Qty: 90 | Fill #3

## 2017-04-20 MED FILL — METOPROLOL SUCCINATE ER 25: 25 | 90 days supply | Qty: 90 | Fill #3

## 2017-04-29 DIAGNOSIS — M5416 Radiculopathy, lumbar region: Secondary | ICD-10-CM | POA: Diagnosis not present

## 2017-04-29 DIAGNOSIS — I10 Essential (primary) hypertension: Secondary | ICD-10-CM | POA: Diagnosis not present

## 2017-04-29 DIAGNOSIS — Z6833 Body mass index (BMI) 33.0-33.9, adult: Secondary | ICD-10-CM | POA: Diagnosis not present

## 2017-05-06 MED FILL — HYDROCHLOROTHIAZIDE 25 MG T: 25 | 60 days supply | Qty: 60 | Fill #6

## 2017-05-19 MED FILL — PENICILLIN VK 500 MG TABLET: 500 | 7 days supply | Qty: 28 | Fill #0

## 2017-06-19 MED FILL — GABAPENTIN 300 MG CAPSULE: 300 | 90 days supply | Qty: 180 | Fill #2

## 2017-06-29 MED FILL — LOVASTATIN 40 MG TABS: 40 | 90 days supply | Qty: 45 | Fill #1

## 2017-06-29 MED FILL — HYDROCHLOROTHIAZIDE 25 MG T: 25 | 90 days supply | Qty: 90 | Fill #0

## 2017-07-20 MED FILL — LOSARTAN POTASSIUM 100 MG T: 100 | 90 days supply | Qty: 90 | Fill #0

## 2017-07-20 MED FILL — METOPROLOL SUCCINATE ER 25: 25 | 90 days supply | Qty: 90 | Fill #0

## 2017-09-03 ENCOUNTER — Other Ambulatory Visit: Payer: Self-pay | Admitting: Family Medicine

## 2017-09-03 DIAGNOSIS — Z1231 Encounter for screening mammogram for malignant neoplasm of breast: Secondary | ICD-10-CM

## 2017-09-24 ENCOUNTER — Ambulatory Visit
Admission: RE | Admit: 2017-09-24 | Discharge: 2017-09-24 | Disposition: A | Discharge status: Home / Self Care | Payer: Medicare Other | Source: Ambulatory Visit | Attending: Family Medicine | Admitting: Family Medicine

## 2017-09-24 DIAGNOSIS — Z1231 Encounter for screening mammogram for malignant neoplasm of breast: Secondary | ICD-10-CM | POA: Diagnosis not present

## 2017-09-29 MED FILL — HYDROCHLOROTHIAZIDE 25 MG T: 25 | 90 days supply | Qty: 90 | Fill #1

## 2017-09-29 MED FILL — GABAPENTIN 300 MG CAPSULE: 300 | 90 days supply | Qty: 180 | Fill #3

## 2017-09-29 MED FILL — LOVASTATIN 40 MG TABS: 40 | 90 days supply | Qty: 45 | Fill #2

## 2017-10-19 MED FILL — LOSARTAN POTASSIUM 100 MG T: 100 | 90 days supply | Qty: 90 | Fill #1

## 2017-10-19 MED FILL — METOPROLOL SUCCINATE ER 25: 25 | 90 days supply | Qty: 90 | Fill #1

## 2017-12-02 ENCOUNTER — Telehealth: Payer: Self-pay | Admitting: Acute Care

## 2017-12-02 NOTE — Telephone Encounter (Signed)
Email received -    ===View-only below this line===   ----- Message -----    From: Gerri Lins    Sent: 11/30/2017 11:51 AM EDT      To: Patient Appointment Schedule Request Mailing List Subject: Appointment Request  Appointment Request From: Gerri Lins  With Provider: Magdalen Spatz, NP [[facility]]  Preferred Date Range: 12/11/2017 - 02/09/2018  Preferred Times: Any time  Reason for visit: Request an Appointment  Comments: Judson Roch,  We have not received notice for our annual chest screening CT.  Just want to make sure we are still on pending list.  Thanks, Rush Landmark and Zaylah Blecha   08/27/1953 and [birth year]

## 2017-12-03 NOTE — Telephone Encounter (Signed)
Rodena Piety has called pt and left message to have her call to get f/u CT's scheduled.  Nothing further needed.

## 2017-12-08 DIAGNOSIS — Z0000 Encounter for general adult medical examination without abnormal findings: Secondary | ICD-10-CM | POA: Diagnosis not present

## 2017-12-08 DIAGNOSIS — E785 Hyperlipidemia, unspecified: Secondary | ICD-10-CM | POA: Diagnosis not present

## 2017-12-08 DIAGNOSIS — Z23 Encounter for immunization: Secondary | ICD-10-CM | POA: Diagnosis not present

## 2017-12-08 DIAGNOSIS — I10 Essential (primary) hypertension: Secondary | ICD-10-CM | POA: Diagnosis not present

## 2017-12-08 DIAGNOSIS — I700 Atherosclerosis of aorta: Secondary | ICD-10-CM | POA: Diagnosis not present

## 2017-12-08 DIAGNOSIS — R7303 Prediabetes: Secondary | ICD-10-CM | POA: Diagnosis not present

## 2017-12-08 DIAGNOSIS — I2510 Atherosclerotic heart disease of native coronary artery without angina pectoris: Secondary | ICD-10-CM | POA: Diagnosis not present

## 2017-12-08 DIAGNOSIS — J432 Centrilobular emphysema: Secondary | ICD-10-CM | POA: Diagnosis not present

## 2017-12-23 ENCOUNTER — Ambulatory Visit (INDEPENDENT_AMBULATORY_CARE_PROVIDER_SITE_OTHER)
Admission: RE | Admit: 2017-12-23 | Discharge: 2017-12-23 | Disposition: A | Discharge status: Home / Self Care | Payer: Medicare Other | Source: Ambulatory Visit | Attending: Acute Care | Admitting: Acute Care

## 2017-12-23 DIAGNOSIS — Z87891 Personal history of nicotine dependence: Secondary | ICD-10-CM

## 2017-12-23 DIAGNOSIS — Z122 Encounter for screening for malignant neoplasm of respiratory organs: Secondary | ICD-10-CM

## 2017-12-29 MED FILL — HYDROCHLOROTHIAZIDE 25 MG T: 25 | 90 days supply | Qty: 90 | Fill #0

## 2017-12-29 MED FILL — LOVASTATIN 40 MG TABS: 40 | 90 days supply | Qty: 45 | Fill #3

## 2017-12-29 MED FILL — GABAPENTIN 300 MG CAPSULE: 300 | 90 days supply | Qty: 180 | Fill #0

## 2018-01-01 ENCOUNTER — Telehealth: Payer: Self-pay | Admitting: Acute Care

## 2018-01-01 DIAGNOSIS — Z122 Encounter for screening for malignant neoplasm of respiratory organs: Secondary | ICD-10-CM

## 2018-01-01 DIAGNOSIS — Z87891 Personal history of nicotine dependence: Secondary | ICD-10-CM

## 2018-01-01 IMAGING — RF DG C-ARM 61-120 MIN
1 series · 2 of 2 positions shown · non-contrast
Comparison: Lumbar MRI 03/18/2016

CLINICAL DATA: L5-S1 fusion

EXAM:
LUMBAR SPINE - 2-3 VIEW; DG C-ARM 61-120 MIN

[Series 1: run · 2 of 2 slices shown]
[im 1/2]
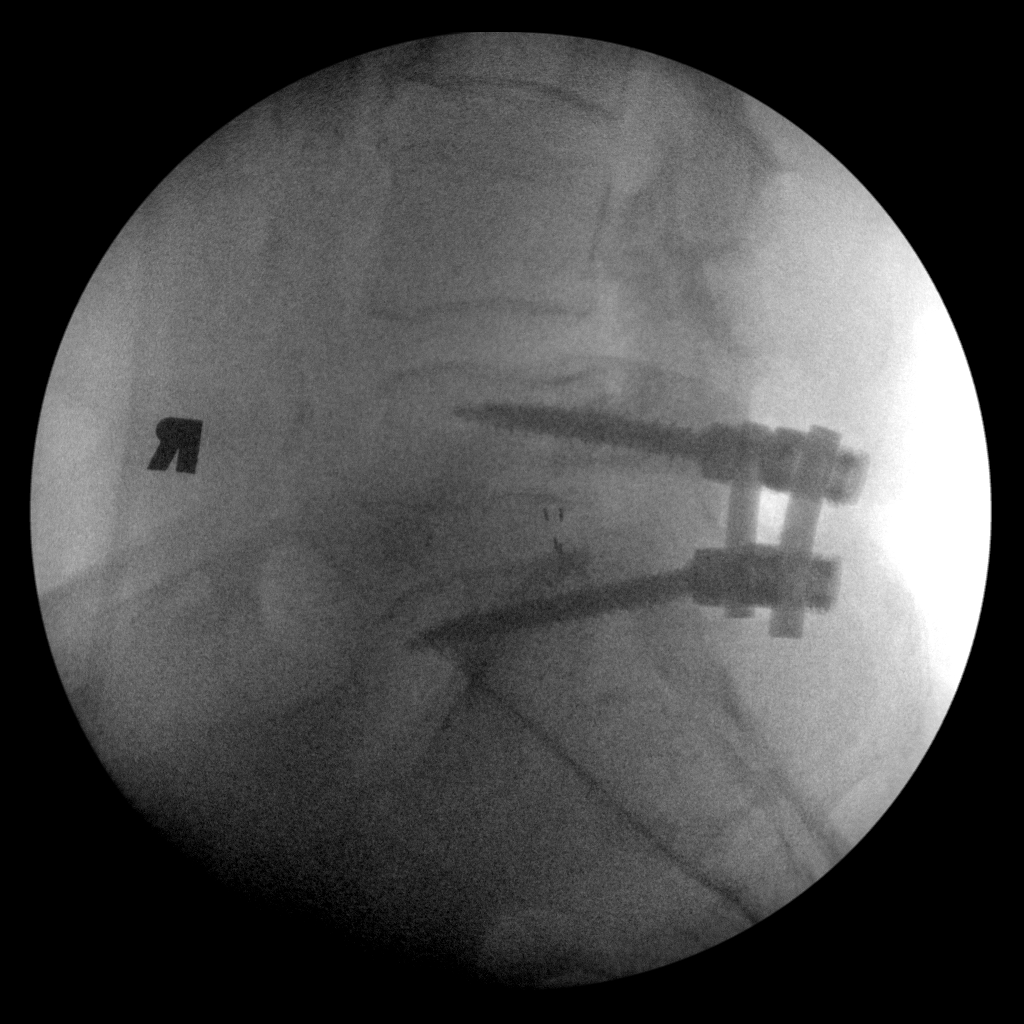
[im 2/2]
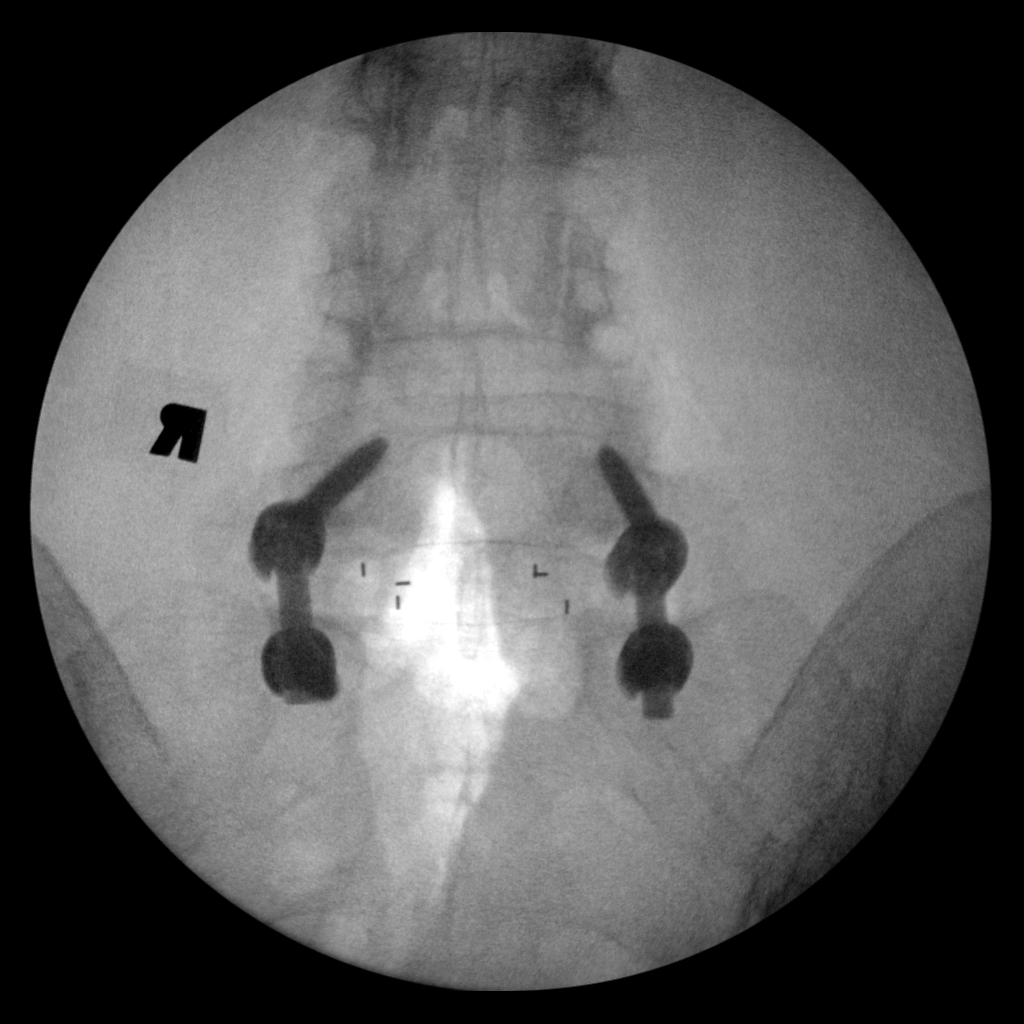

[2 of 2 positions shown; findings below may reference images not displayed]

FINDINGS: AP and lateral C-arm images were obtained. Pedicle screw and
interbody fusion at L5-S1. Hardware in satisfactory position. No
acute complication mild anterior slip L4-5 unchanged.
IMPRESSION: Satisfactory PLIF at L5-S1.

## 2018-01-01 IMAGING — CR DG LUMBAR SPINE 1V
1 series · 1 of 1 positions shown · non-contrast
Comparison: 03/18/2016

CLINICAL DATA: Intraoperative localization for L5-S1 PLIF

EXAM:
LUMBAR SPINE - 1 VIEW

[lateral]
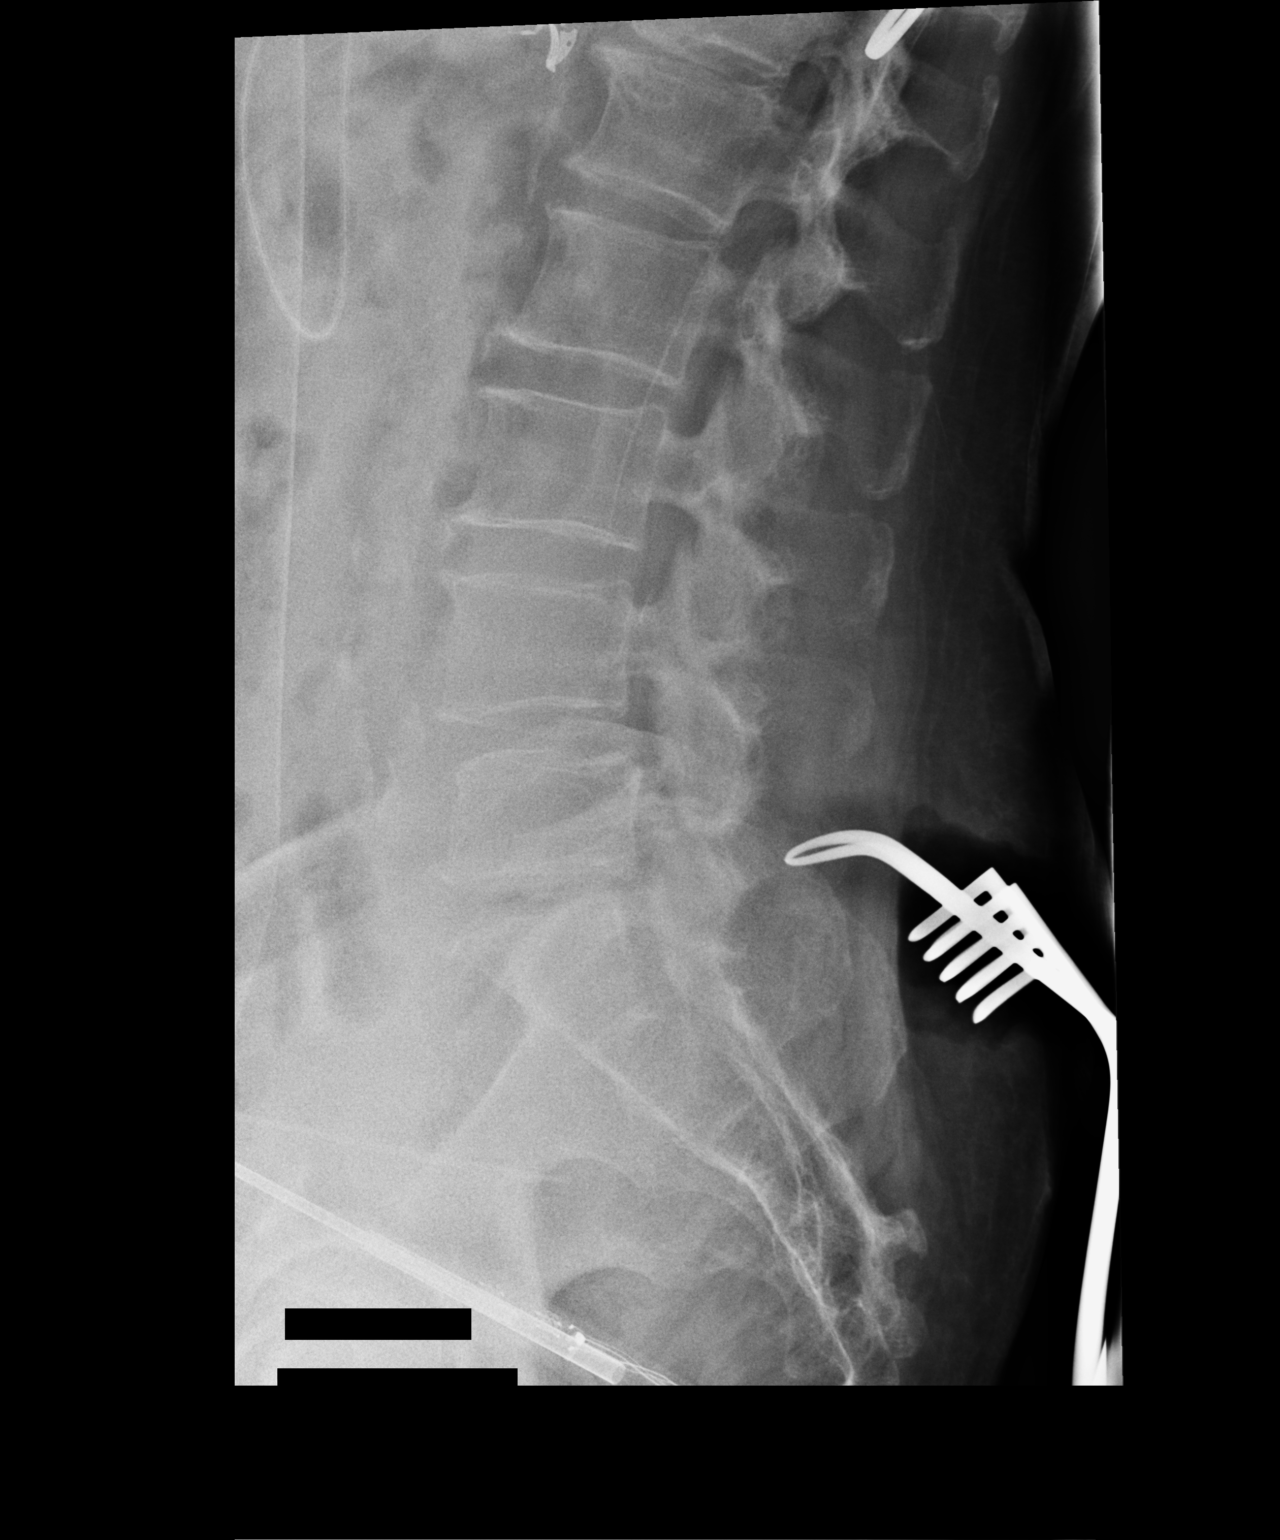

[1 of 1 positions shown; findings below may reference images not displayed]

FINDINGS: Surgical instruments and retractors are noted posterior to the L5-S1
interspace. Numbering nomenclature is similar to that used on prior
MRI examination.
IMPRESSION: Intraoperative localization at L5-S1.

## 2018-01-01 NOTE — Telephone Encounter (Signed)
Pt informed of CT results per Sarah Groce, NP.  PT verbalized understanding.  Copy sent to PCP.  Order placed for 1 yr f/u CT.  

## 2018-01-14 DIAGNOSIS — Z6832 Body mass index (BMI) 32.0-32.9, adult: Secondary | ICD-10-CM | POA: Diagnosis not present

## 2018-01-14 DIAGNOSIS — M5416 Radiculopathy, lumbar region: Secondary | ICD-10-CM | POA: Diagnosis not present

## 2018-01-18 MED FILL — LOSARTAN POTASSIUM 100 MG T: 100 | 90 days supply | Qty: 90 | Fill #0

## 2018-01-18 MED FILL — METOPROLOL SUCCINATE ER 25: 25 | 90 days supply | Qty: 90 | Fill #0

## 2018-02-22 DIAGNOSIS — R05 Cough: Secondary | ICD-10-CM | POA: Diagnosis not present

## 2018-02-22 DIAGNOSIS — J0100 Acute maxillary sinusitis, unspecified: Secondary | ICD-10-CM | POA: Diagnosis not present

## 2018-02-22 MED FILL — levoFLOXacin 500 MG TABS: 500 | 10 days supply | Qty: 10 | Fill #0

## 2018-04-01 MED FILL — LOVASTATIN 40 MG TABS: 40 | 90 days supply | Qty: 45 | Fill #0

## 2018-04-01 MED FILL — HYDROCHLOROTHIAZIDE 25 MG T: 25 | 90 days supply | Qty: 90 | Fill #1

## 2018-04-05 MED FILL — GABAPENTIN 300 MG CAPSULE: 300 | 90 days supply | Qty: 180 | Fill #1

## 2018-04-20 MED FILL — METOPROLOL SUCCINATE ER 25: 25 | 90 days supply | Qty: 90 | Fill #1

## 2018-04-20 MED FILL — LOSARTAN POTASSIUM 100 MG T: 100 | 90 days supply | Qty: 90 | Fill #1

## 2018-06-25 DIAGNOSIS — Z808 Family history of malignant neoplasm of other organs or systems: Secondary | ICD-10-CM | POA: Diagnosis not present

## 2018-06-25 DIAGNOSIS — D225 Melanocytic nevi of trunk: Secondary | ICD-10-CM | POA: Diagnosis not present

## 2018-06-25 DIAGNOSIS — L219 Seborrheic dermatitis, unspecified: Secondary | ICD-10-CM | POA: Diagnosis not present

## 2018-06-25 DIAGNOSIS — L820 Inflamed seborrheic keratosis: Secondary | ICD-10-CM | POA: Diagnosis not present

## 2018-06-25 DIAGNOSIS — L723 Sebaceous cyst: Secondary | ICD-10-CM | POA: Diagnosis not present

## 2018-06-25 DIAGNOSIS — L821 Other seborrheic keratosis: Secondary | ICD-10-CM | POA: Diagnosis not present

## 2018-06-25 MED FILL — FLUOCINONIDE 0.05 % SOLN: 0.05 | 30 days supply | Qty: 60 | Fill #0

## 2018-07-06 MED FILL — HYDROCHLOROTHIAZIDE 25 MG T: 25 | 90 days supply | Qty: 90 | Fill #2

## 2018-07-06 MED FILL — GABAPENTIN 300 MG CAPSULE: 300 | 90 days supply | Qty: 180 | Fill #2

## 2018-07-06 MED FILL — LOVASTATIN 40 MG TABS: 40 | 90 days supply | Qty: 45 | Fill #1

## 2018-07-19 MED FILL — METOPROLOL SUCCINATE ER 25: 25 | 90 days supply | Qty: 90 | Fill #2

## 2018-07-19 MED FILL — LOSARTAN POTASSIUM 100 MG T: 100 | 90 days supply | Qty: 90 | Fill #2

## 2018-08-16 ENCOUNTER — Other Ambulatory Visit: Payer: Self-pay | Admitting: Family Medicine

## 2018-08-16 DIAGNOSIS — Z1231 Encounter for screening mammogram for malignant neoplasm of breast: Secondary | ICD-10-CM

## 2018-09-27 ENCOUNTER — Ambulatory Visit
Admission: RE | Admit: 2018-09-27 | Discharge: 2018-09-27 | Disposition: A | Discharge status: Home / Self Care | Payer: Medicare Other | Source: Ambulatory Visit | Attending: Family Medicine | Admitting: Family Medicine

## 2018-09-27 ENCOUNTER — Other Ambulatory Visit: Payer: Self-pay

## 2018-09-27 DIAGNOSIS — Z1231 Encounter for screening mammogram for malignant neoplasm of breast: Secondary | ICD-10-CM | POA: Diagnosis not present

## 2018-10-01 DIAGNOSIS — H43813 Vitreous degeneration, bilateral: Secondary | ICD-10-CM | POA: Diagnosis not present

## 2018-10-01 DIAGNOSIS — H04123 Dry eye syndrome of bilateral lacrimal glands: Secondary | ICD-10-CM | POA: Diagnosis not present

## 2018-10-01 DIAGNOSIS — H2513 Age-related nuclear cataract, bilateral: Secondary | ICD-10-CM | POA: Diagnosis not present

## 2018-10-01 DIAGNOSIS — H524 Presbyopia: Secondary | ICD-10-CM | POA: Diagnosis not present

## 2018-10-01 MED FILL — GABAPENTIN 300 MG CAPSULE: 300 | 90 days supply | Qty: 180 | Fill #3

## 2018-10-01 MED FILL — LOVASTATIN 40 MG TABS: 40 | 90 days supply | Qty: 45 | Fill #2

## 2018-10-01 MED FILL — HYDROCHLOROTHIAZIDE 25 MG T: 25 | 90 days supply | Qty: 90 | Fill #3

## 2018-10-19 MED FILL — LOSARTAN POTASSIUM 100 MG T: 100 | 90 days supply | Qty: 90 | Fill #3

## 2018-10-19 MED FILL — METOPROLOL SUCCINATE ER 25: 25 | 90 days supply | Qty: 90 | Fill #3

## 2018-11-16 DIAGNOSIS — Z23 Encounter for immunization: Secondary | ICD-10-CM | POA: Diagnosis not present

## 2018-11-16 NOTE — Progress Notes (Signed)
Cardiology Office Note   Date:  11/19/2018   ID:  Alicia, Hogan 1950/05/26, MRN [id number]  PCP:  Maurice Small, MD  Cardiologist: Darlin Coco MD  No chief complaint on file.     History of Present Illness: Alicia Hogan is a 68 y.o.  female who presents for cardiology follow-up. Not seen since 2018 Previously seen by Dr Mare Ferrari on 06/13/14 for evaluation of PVCs and a new left bundle branch block.  She underwent Lexiscan Myoview stress test on 06/23/14.  The ejection fraction was normal at 64% and there were no perfusion deficits.  The patient also underwent an echocardiogram on 06/23/14.  The echo showed mild LVH and was otherwise normal with ejection fraction of 55-60%.  She was placed on Lopressor 12.5 mg twice a day for control of her PVCs.  She reports that her PVCs have resolved.  She has not been having any exertional chest pain.  She does have some mild exertional dyspnea when she walks up a steep hill.  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  She tries to walk up the stairs at the hospital for exercise.  She also walks in her neighborhood with her husband and they also enjoy dancing for exercise   Some issues with plantar Fasciatus sees Dr Paulla Dolly  Thinks this relulted In neuropathy that she takes Neurontin for  Also L5/S1 disc dx and has had injections   Called office end of August 09/2016 complaining of palpitations and exertional dyspnea   Echo: 10/10/16 reviewed Normal EF 55-60% Holter: 10/14/16 reviewed SR infrequent unifocal PVCls less than 1% of total beats  Had failed L5/S1 disectomy and needed a fusion with Dr Ellene Route in February2019 Slow to heal. 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 Arthritis   . Cancer ([place])   . COPD (chronic obstructive pulmonary disease) ([facility])    from ct  result  . Dysrhythmia    RBB hx of  . GERD (gastroesophageal reflux disease)    occ  . Hypertension   . Plantar fasciitis 12/28/2013    Past Surgical History:  Procedure Laterality Date  . BACK SURGERY  11/2015   lumbar foraminotomy  . COLONOSCOPY    . HEMORRHOID SURGERY N/A 12/17/2016   Procedure: 3 COLUMN HEMORRHOIDECTOMY;  Surgeon: Leighton Ruff, MD;  Location: [facility];  Service: General;  Laterality: N/A;  . L 5 to S 1 fusion  04/08/2016  . MENISCUS REPAIR Left 2012  . TONSILLECTOMY  as child   and adenoidectomy     Current Outpatient Medications  Medication Sig Dispense Refill  . aspirin 81 MG tablet Take 81 mg by mouth daily.    Marland Kitchen docusate sodium (COLACE) 100 MG capsule Take 100 mg by mouth daily.    Marland Kitchen gabapentin (NEURONTIN) 300 MG capsule Take 600 mg by mouth at bedtime. Reported on 06/04/2015    . hydrochlorothiazide (HYDRODIURIL) 25 MG tablet Take 25 mg by mouth every morning.  11  . HYDROcodone-acetaminophen (NORCO/VICODIN) 5-325 MG tablet Take 1-2 tablets every 6 (six) hours as needed by mouth. 40 tablet 0  . ibuprofen (ADVIL,MOTRIN) 200 MG tablet Take 200 mg every 6 (six) hours as needed by mouth.    . losartan (COZAAR) 100 MG  tablet Take 100 mg by mouth daily.  3  . lovastatin (MEVACOR) 40 MG tablet Take 20 mg by mouth every evening.  2  . metoprolol succinate (TOPROL XL) 25 MG 24 hr tablet Take 1 tablet (25 mg total) by mouth daily. 90 tablet 3  . Multiple Vitamins-Minerals (MULTIVITAMIN PO) Take 1 tablet by mouth daily.     No current facility-administered medications for this visit.     Allergies:   Lisinopril, Sunscreens, and Codeine    Social History:  The patient  reports that she quit smoking about 10 years ago. Her smoking use included cigarettes. She started smoking about 49 years ago. She has a 43.00 pack-year smoking history. She has never used smokeless tobacco. She reports that she does not drink alcohol or use drugs.   Family  History:  The patient's  family history includes Alzheimer's disease in her mother; Diabetes in her maternal grandmother; Hypertension in her mother; Lung cancer in her father; Stroke in her maternal grandmother.    ROS:  Please see the history of present illness.   Otherwise, review of systems are positive for none.   All other systems are reviewed and negative.    PHYSICAL EXAM: VS:  Ht 5\' 7"  (1.702 m)   Wt 233 lb (105.7 kg)   BMI 36.49 kg/m  , BMI Body mass index is 36.49 kg/m. Affect appropriate Healthy:  appears stated age 53: normal Neck supple with no adenopathy JVP normal no bruits no thyromegaly Lungs clear with no wheezing and good diaphragmatic motion Heart:  S1/S2 no murmur, no rub, gallop or click PMI normal Abdomen: benighn, BS positve, no tenderness, no AAA no bruit.  No HSM or HJR Distal pulses intact with no bruits No edema Neuro non-focal Skin warm and dry No muscular weakness    EKG:   10/04/14 normal sinus rhythm at 60 bpm.  Left bundle branch block is unchanged.  Since the previous tracing her PVCs have disappeared. 09/18/15  SR rate 59 normal SR rate 62 low voltage normal 11/06/16 SR normal no LBBB 11/19/18  SR rate 59 low voltage LBBB gone no PVC's   Recent Labs: No results found for requested labs within last 8760 hours.    Lipid Panel No results found for: CHOL, TRIG, HDL, CHOLHDL, VLDL, LDLCALC, LDLDIRECT    Wt Readings from Last 3 Encounters:  11/19/18 233 lb (105.7 kg)  12/17/16 228 lb (103.4 kg)  11/06/16 227 lb (103 kg)         ASSESSMENT AND PLAN:  1.  Left bundle branch block, currently asymptomatic.  Myoview showed no evidence of ischemia.  She has normal left ventricular systolic function. ECG normal today  2.  Essential hypertension controlled on current medications 3.  Plantar fasciitis. Continue orthotics and f/u Regal 4. Neuropathy  qhs Neurontin stable no diabetes  5. Chol:  Continue statin labs with Dr Justin Mend 6.  Palpitations: benign limited PVCls on holter in setting normal EF continue beta blocker Rx 7. Dyspnea: normal exam normal echo Former smoker with 43 pack year history Lung cancer Screening CT 12/23/17 showed diffuse bronchial wall thickening and emphysema no cancer    Disposition:  F/u in a year     [employer]

## 2018-11-19 ENCOUNTER — Encounter: Payer: Self-pay | Admitting: Cardiovascular Disease

## 2018-11-19 ENCOUNTER — Ambulatory Visit (INDEPENDENT_AMBULATORY_CARE_PROVIDER_SITE_OTHER): Payer: Medicare Other | Admitting: Cardiovascular Disease

## 2018-11-19 ENCOUNTER — Other Ambulatory Visit: Payer: Self-pay

## 2018-11-19 VITALS — BP 106/70 | HR 59 | Ht 67.0 in | Wt 233.0 lb

## 2018-11-19 DIAGNOSIS — I447 Left bundle-branch block, unspecified: Secondary | ICD-10-CM

## 2018-11-19 NOTE — Patient Instructions (Signed)

## 2018-12-29 DIAGNOSIS — E785 Hyperlipidemia, unspecified: Secondary | ICD-10-CM | POA: Diagnosis not present

## 2018-12-29 DIAGNOSIS — J432 Centrilobular emphysema: Secondary | ICD-10-CM | POA: Diagnosis not present

## 2018-12-29 DIAGNOSIS — I700 Atherosclerosis of aorta: Secondary | ICD-10-CM | POA: Diagnosis not present

## 2018-12-29 DIAGNOSIS — R7303 Prediabetes: Secondary | ICD-10-CM | POA: Diagnosis not present

## 2018-12-29 DIAGNOSIS — I2510 Atherosclerotic heart disease of native coronary artery without angina pectoris: Secondary | ICD-10-CM | POA: Diagnosis not present

## 2018-12-29 DIAGNOSIS — Z23 Encounter for immunization: Secondary | ICD-10-CM | POA: Diagnosis not present

## 2018-12-29 DIAGNOSIS — I10 Essential (primary) hypertension: Secondary | ICD-10-CM | POA: Diagnosis not present

## 2018-12-29 DIAGNOSIS — Z0000 Encounter for general adult medical examination without abnormal findings: Secondary | ICD-10-CM | POA: Diagnosis not present

## 2018-12-29 MED FILL — GABAPENTIN 300 MG CAPSULE: 300 | 90 days supply | Qty: 270 | Fill #0

## 2018-12-30 MED FILL — HYDROCHLOROTHIAZIDE 25 MG T: 25 | 90 days supply | Qty: 90 | Fill #0

## 2018-12-30 MED FILL — LOVASTATIN 40 MG TABS: 40 | 90 days supply | Qty: 45 | Fill #0

## 2019-01-12 ENCOUNTER — Ambulatory Visit
Admission: RE | Admit: 2019-01-12 | Discharge: 2019-01-12 | Disposition: A | Discharge status: Home / Self Care | Payer: Medicare Other | Source: Ambulatory Visit | Attending: Acute Care | Admitting: Acute Care

## 2019-01-12 DIAGNOSIS — Z87891 Personal history of nicotine dependence: Secondary | ICD-10-CM

## 2019-01-12 DIAGNOSIS — Z122 Encounter for screening for malignant neoplasm of respiratory organs: Secondary | ICD-10-CM

## 2019-01-17 MED FILL — LOSARTAN POTASSIUM 100 MG T: 100 | 90 days supply | Qty: 90 | Fill #0

## 2019-01-17 MED FILL — METOPROLOL SUCCINATE ER 25: 25 | 90 days supply | Qty: 90 | Fill #0

## 2019-01-24 ENCOUNTER — Other Ambulatory Visit: Payer: Self-pay | Admitting: *Deleted

## 2019-01-24 ENCOUNTER — Encounter: Payer: Self-pay | Admitting: *Deleted

## 2019-01-24 DIAGNOSIS — Z87891 Personal history of nicotine dependence: Secondary | ICD-10-CM

## 2019-03-11 ENCOUNTER — Ambulatory Visit: Payer: Medicare Other

## 2019-03-16 ENCOUNTER — Ambulatory Visit: Payer: Medicare Other

## 2019-03-17 DIAGNOSIS — L739 Follicular disorder, unspecified: Secondary | ICD-10-CM | POA: Diagnosis not present

## 2019-03-17 DIAGNOSIS — D485 Neoplasm of uncertain behavior of skin: Secondary | ICD-10-CM | POA: Diagnosis not present

## 2019-03-17 DIAGNOSIS — L820 Inflamed seborrheic keratosis: Secondary | ICD-10-CM | POA: Diagnosis not present

## 2019-03-17 DIAGNOSIS — Z23 Encounter for immunization: Secondary | ICD-10-CM | POA: Diagnosis not present

## 2019-03-17 MED FILL — DOXYCYCLINE HYC 20 MG TAB: 20 | 30 days supply | Qty: 60 | Fill #0

## 2019-03-20 ENCOUNTER — Ambulatory Visit: Payer: Medicare Other | Attending: Internal Medicine

## 2019-03-20 DIAGNOSIS — Z23 Encounter for immunization: Secondary | ICD-10-CM | POA: Insufficient documentation

## 2019-03-20 NOTE — Progress Notes (Signed)
   Covid-19 Vaccination Clinic  Name:  Alicia Hogan    MRN: [id number] DOB: Mar 22, 1950  03/20/2019  [name] was observed post Covid-19 immunization for 15 minutes without incidence. She was provided with Vaccine Information Sheet and instruction to access the V-Safe system.   [name] was instructed to call 911 with any severe reactions post vaccine: Marland Kitchen Difficulty breathing  . Swelling of your face and throat  . A fast heartbeat  . A bad rash all over your body  . Dizziness and weakness    Immunizations Administered    Name Date Dose VIS Date Route   Pfizer COVID-19 Vaccine 03/20/2019  8:17 AM 0.3 mL 01/21/2019 Intramuscular   Manufacturer: [place]   Lot: [id number]   [place]: [id number]

## 2019-04-13 ENCOUNTER — Ambulatory Visit: Payer: Medicare Other | Attending: Internal Medicine

## 2019-04-13 DIAGNOSIS — Z23 Encounter for immunization: Secondary | ICD-10-CM

## 2019-04-13 NOTE — Progress Notes (Signed)
   Covid-19 Vaccination Clinic  Name:  EMARY FRAGOSO    MRN: [id number] DOB: [birth year]  04/13/2019  Ms. Banis was observed post Covid-19 immunization for 15 minutes without incident. She was provided with Vaccine Information Sheet and instruction to access the V-Safe system.   Ms. Silverman was instructed to call 911 with any severe reactions post vaccine: Marland Kitchen Difficulty breathing  . Swelling of face and throat  . A fast heartbeat  . A bad rash all over body  . Dizziness and weakness   Immunizations Administered    Name Date Dose VIS Date Route   Pfizer COVID-19 Vaccine 04/13/2019  1:54 PM 0.3 mL 01/21/2019 Intramuscular   Manufacturer: [place]   Lot: [id number]   [place]: [id number]

## 2019-06-30 DIAGNOSIS — R238 Other skin changes: Secondary | ICD-10-CM | POA: Diagnosis not present

## 2019-06-30 DIAGNOSIS — L570 Actinic keratosis: Secondary | ICD-10-CM | POA: Diagnosis not present

## 2019-06-30 DIAGNOSIS — L821 Other seborrheic keratosis: Secondary | ICD-10-CM | POA: Diagnosis not present

## 2019-06-30 DIAGNOSIS — L739 Follicular disorder, unspecified: Secondary | ICD-10-CM | POA: Diagnosis not present

## 2019-06-30 DIAGNOSIS — B078 Other viral warts: Secondary | ICD-10-CM | POA: Diagnosis not present

## 2019-06-30 DIAGNOSIS — D225 Melanocytic nevi of trunk: Secondary | ICD-10-CM | POA: Diagnosis not present

## 2019-06-30 DIAGNOSIS — L578 Other skin changes due to chronic exposure to nonionizing radiation: Secondary | ICD-10-CM | POA: Diagnosis not present

## 2019-06-30 DIAGNOSIS — Z808 Family history of malignant neoplasm of other organs or systems: Secondary | ICD-10-CM | POA: Diagnosis not present

## 2019-08-24 ENCOUNTER — Other Ambulatory Visit: Payer: Self-pay | Admitting: Family Medicine

## 2019-08-24 DIAGNOSIS — Z1231 Encounter for screening mammogram for malignant neoplasm of breast: Secondary | ICD-10-CM

## 2019-09-29 ENCOUNTER — Ambulatory Visit
Admission: RE | Admit: 2019-09-29 | Discharge: 2019-09-29 | Disposition: A | Discharge status: Home / Self Care | Payer: Medicare Other | Source: Ambulatory Visit | Attending: Family Medicine | Admitting: Family Medicine

## 2019-09-29 ENCOUNTER — Other Ambulatory Visit: Payer: Self-pay

## 2019-09-29 DIAGNOSIS — Z1231 Encounter for screening mammogram for malignant neoplasm of breast: Secondary | ICD-10-CM | POA: Diagnosis not present

## 2019-10-03 ENCOUNTER — Other Ambulatory Visit: Payer: Self-pay | Admitting: Family Medicine

## 2019-10-03 DIAGNOSIS — R928 Other abnormal and inconclusive findings on diagnostic imaging of breast: Secondary | ICD-10-CM

## 2019-10-19 ENCOUNTER — Other Ambulatory Visit: Payer: Self-pay | Admitting: Family Medicine

## 2019-10-19 ENCOUNTER — Ambulatory Visit
Admission: RE | Admit: 2019-10-19 | Discharge: 2019-10-19 | Disposition: A | Discharge status: Home / Self Care | Payer: Medicare Other | Source: Ambulatory Visit | Attending: Family Medicine | Admitting: Family Medicine

## 2019-10-19 ENCOUNTER — Other Ambulatory Visit: Payer: Self-pay

## 2019-10-19 DIAGNOSIS — R928 Other abnormal and inconclusive findings on diagnostic imaging of breast: Secondary | ICD-10-CM

## 2019-10-19 DIAGNOSIS — R921 Mammographic calcification found on diagnostic imaging of breast: Secondary | ICD-10-CM

## 2019-10-25 DIAGNOSIS — Z23 Encounter for immunization: Secondary | ICD-10-CM | POA: Diagnosis not present

## 2019-11-16 DIAGNOSIS — Z23 Encounter for immunization: Secondary | ICD-10-CM | POA: Diagnosis not present

## 2020-01-11 DIAGNOSIS — E785 Hyperlipidemia, unspecified: Secondary | ICD-10-CM | POA: Diagnosis not present

## 2020-01-11 DIAGNOSIS — J432 Centrilobular emphysema: Secondary | ICD-10-CM | POA: Diagnosis not present

## 2020-01-11 DIAGNOSIS — Z0000 Encounter for general adult medical examination without abnormal findings: Secondary | ICD-10-CM | POA: Diagnosis not present

## 2020-01-11 DIAGNOSIS — I2510 Atherosclerotic heart disease of native coronary artery without angina pectoris: Secondary | ICD-10-CM | POA: Diagnosis not present

## 2020-01-11 DIAGNOSIS — I700 Atherosclerosis of aorta: Secondary | ICD-10-CM | POA: Diagnosis not present

## 2020-01-11 DIAGNOSIS — I10 Essential (primary) hypertension: Secondary | ICD-10-CM | POA: Diagnosis not present

## 2020-01-11 DIAGNOSIS — R7303 Prediabetes: Secondary | ICD-10-CM | POA: Diagnosis not present

## 2020-01-19 ENCOUNTER — Ambulatory Visit
Admission: RE | Admit: 2020-01-19 | Discharge: 2020-01-19 | Disposition: A | Discharge status: Home / Self Care | Payer: Medicare Other | Source: Ambulatory Visit | Attending: Acute Care | Admitting: Acute Care

## 2020-01-19 DIAGNOSIS — M47814 Spondylosis without myelopathy or radiculopathy, thoracic region: Secondary | ICD-10-CM | POA: Diagnosis not present

## 2020-01-19 DIAGNOSIS — Z87891 Personal history of nicotine dependence: Secondary | ICD-10-CM | POA: Diagnosis not present

## 2020-01-19 DIAGNOSIS — I2510 Atherosclerotic heart disease of native coronary artery without angina pectoris: Secondary | ICD-10-CM | POA: Diagnosis not present

## 2020-01-19 DIAGNOSIS — J432 Centrilobular emphysema: Secondary | ICD-10-CM | POA: Diagnosis not present

## 2020-01-26 ENCOUNTER — Other Ambulatory Visit: Payer: Self-pay | Admitting: *Deleted

## 2020-01-26 DIAGNOSIS — Z87891 Personal history of nicotine dependence: Secondary | ICD-10-CM

## 2020-01-26 NOTE — Progress Notes (Signed)
Please call patient and let them  know their  low dose Ct was read as a Lung RADS 2: nodules that are benign in appearance and behavior with a very low likelihood of becoming a clinically active cancer due to size or lack of growth. Recommendation per radiology is for a repeat LDCT in 12 months. .Please let them  know we will order and schedule their  annual screening scan for 01/2021. Please let them  know there was notation of CAD on their  scan.  Please remind the patient  that this is a non-gated exam therefore degree or severity of disease  cannot be determined. Please have them  follow up with their PCP regarding potential risk factor modification, dietary therapy or pharmacologic therapy if clinically indicated. Pt.  is  currently on statin therapy. Please place order for annual  screening scan for  01/2021 and fax results to PCP. Thanks so much. This patient is followed by cards. Please let her know there was notation of Hepatic steatosis. This  is a term that describes the build up of fat in the liver. It is normal to have small amounts of fat in your liver, but when the proportion of liver cells that contain fat exceeds more than 5% it is indicative of early stage fatty liver.Treatment often involves reducing risk factors through a diet and exercise plan. It is generally a benign condition, but in a small percentage of patients it does require follow up. Please have the patient follow up with PCP regarding potential risk factor modification, dietary therapy or pharmacologic therapy if clinically indicated.  Thanks so much

## 2020-04-02 DIAGNOSIS — H5005 Alternating esotropia: Secondary | ICD-10-CM | POA: Diagnosis not present

## 2020-04-02 DIAGNOSIS — H52223 Regular astigmatism, bilateral: Secondary | ICD-10-CM | POA: Diagnosis not present

## 2020-04-25 ENCOUNTER — Other Ambulatory Visit: Payer: Self-pay

## 2020-04-25 ENCOUNTER — Other Ambulatory Visit: Payer: Self-pay | Admitting: Family Medicine

## 2020-04-25 ENCOUNTER — Ambulatory Visit
Admission: RE | Admit: 2020-04-25 | Discharge: 2020-04-25 | Disposition: A | Discharge status: Home / Self Care | Payer: Medicare Other | Source: Ambulatory Visit | Attending: Family Medicine | Admitting: Family Medicine

## 2020-04-25 DIAGNOSIS — R921 Mammographic calcification found on diagnostic imaging of breast: Secondary | ICD-10-CM | POA: Diagnosis not present

## 2020-07-03 DIAGNOSIS — L739 Follicular disorder, unspecified: Secondary | ICD-10-CM | POA: Diagnosis not present

## 2020-07-03 DIAGNOSIS — H2513 Age-related nuclear cataract, bilateral: Secondary | ICD-10-CM | POA: Diagnosis not present

## 2020-07-03 DIAGNOSIS — H43813 Vitreous degeneration, bilateral: Secondary | ICD-10-CM | POA: Diagnosis not present

## 2020-07-03 DIAGNOSIS — Z808 Family history of malignant neoplasm of other organs or systems: Secondary | ICD-10-CM | POA: Diagnosis not present

## 2020-07-03 DIAGNOSIS — B079 Viral wart, unspecified: Secondary | ICD-10-CM | POA: Diagnosis not present

## 2020-07-03 DIAGNOSIS — D485 Neoplasm of uncertain behavior of skin: Secondary | ICD-10-CM | POA: Diagnosis not present

## 2020-07-03 DIAGNOSIS — H5000 Unspecified esotropia: Secondary | ICD-10-CM | POA: Diagnosis not present

## 2020-07-03 DIAGNOSIS — L281 Prurigo nodularis: Secondary | ICD-10-CM | POA: Diagnosis not present

## 2020-07-03 DIAGNOSIS — D225 Melanocytic nevi of trunk: Secondary | ICD-10-CM | POA: Diagnosis not present

## 2020-07-03 DIAGNOSIS — L821 Other seborrheic keratosis: Secondary | ICD-10-CM | POA: Diagnosis not present

## 2020-07-03 DIAGNOSIS — L578 Other skin changes due to chronic exposure to nonionizing radiation: Secondary | ICD-10-CM | POA: Diagnosis not present

## 2020-07-06 DIAGNOSIS — Z23 Encounter for immunization: Secondary | ICD-10-CM | POA: Diagnosis not present

## 2020-10-01 ENCOUNTER — Other Ambulatory Visit: Payer: Self-pay

## 2020-10-01 ENCOUNTER — Ambulatory Visit
Admission: RE | Admit: 2020-10-01 | Discharge: 2020-10-01 | Disposition: A | Discharge status: Home / Self Care | Payer: Medicare Other | Source: Ambulatory Visit | Attending: Family Medicine | Admitting: Family Medicine

## 2020-10-01 DIAGNOSIS — R921 Mammographic calcification found on diagnostic imaging of breast: Secondary | ICD-10-CM | POA: Diagnosis not present

## 2020-10-30 DIAGNOSIS — Z23 Encounter for immunization: Secondary | ICD-10-CM | POA: Diagnosis not present

## 2020-12-03 DIAGNOSIS — Z23 Encounter for immunization: Secondary | ICD-10-CM | POA: Diagnosis not present

## 2021-01-23 ENCOUNTER — Other Ambulatory Visit: Payer: Self-pay | Admitting: Acute Care

## 2021-01-23 DIAGNOSIS — Z87891 Personal history of nicotine dependence: Secondary | ICD-10-CM

## 2021-01-31 DIAGNOSIS — Z79899 Other long term (current) drug therapy: Secondary | ICD-10-CM | POA: Diagnosis not present

## 2021-01-31 DIAGNOSIS — R7303 Prediabetes: Secondary | ICD-10-CM | POA: Diagnosis not present

## 2021-01-31 DIAGNOSIS — K760 Fatty (change of) liver, not elsewhere classified: Secondary | ICD-10-CM | POA: Diagnosis not present

## 2021-01-31 DIAGNOSIS — I10 Essential (primary) hypertension: Secondary | ICD-10-CM | POA: Diagnosis not present

## 2021-01-31 DIAGNOSIS — E785 Hyperlipidemia, unspecified: Secondary | ICD-10-CM | POA: Diagnosis not present

## 2021-01-31 DIAGNOSIS — Z0000 Encounter for general adult medical examination without abnormal findings: Secondary | ICD-10-CM | POA: Diagnosis not present

## 2021-01-31 DIAGNOSIS — I700 Atherosclerosis of aorta: Secondary | ICD-10-CM | POA: Diagnosis not present

## 2021-01-31 DIAGNOSIS — J432 Centrilobular emphysema: Secondary | ICD-10-CM | POA: Diagnosis not present

## 2021-01-31 DIAGNOSIS — I2510 Atherosclerotic heart disease of native coronary artery without angina pectoris: Secondary | ICD-10-CM | POA: Diagnosis not present

## 2021-02-27 ENCOUNTER — Other Ambulatory Visit: Payer: Self-pay | Admitting: Family Medicine

## 2021-02-27 DIAGNOSIS — E2839 Other primary ovarian failure: Secondary | ICD-10-CM

## 2021-03-25 DIAGNOSIS — E1169 Type 2 diabetes mellitus with other specified complication: Secondary | ICD-10-CM | POA: Diagnosis not present

## 2021-03-27 ENCOUNTER — Other Ambulatory Visit: Payer: Self-pay

## 2021-03-27 ENCOUNTER — Ambulatory Visit
Admission: RE | Admit: 2021-03-27 | Discharge: 2021-03-27 | Disposition: A | Discharge status: Home / Self Care | Payer: Medicare Other | Source: Ambulatory Visit | Attending: Acute Care | Admitting: Acute Care

## 2021-03-27 DIAGNOSIS — I2510 Atherosclerotic heart disease of native coronary artery without angina pectoris: Secondary | ICD-10-CM | POA: Diagnosis not present

## 2021-03-27 DIAGNOSIS — M47814 Spondylosis without myelopathy or radiculopathy, thoracic region: Secondary | ICD-10-CM | POA: Diagnosis not present

## 2021-03-27 DIAGNOSIS — J432 Centrilobular emphysema: Secondary | ICD-10-CM | POA: Diagnosis not present

## 2021-03-27 DIAGNOSIS — Z87891 Personal history of nicotine dependence: Secondary | ICD-10-CM

## 2021-03-28 ENCOUNTER — Other Ambulatory Visit: Payer: Medicare Other

## 2021-03-29 ENCOUNTER — Other Ambulatory Visit: Payer: Self-pay

## 2021-03-29 DIAGNOSIS — Z87891 Personal history of nicotine dependence: Secondary | ICD-10-CM

## 2021-05-01 DIAGNOSIS — E1169 Type 2 diabetes mellitus with other specified complication: Secondary | ICD-10-CM | POA: Diagnosis not present

## 2021-05-01 DIAGNOSIS — I10 Essential (primary) hypertension: Secondary | ICD-10-CM | POA: Diagnosis not present

## 2021-05-01 DIAGNOSIS — E785 Hyperlipidemia, unspecified: Secondary | ICD-10-CM | POA: Diagnosis not present

## 2021-05-03 NOTE — Progress Notes (Signed)
?  ?Cardiology Office Note ? ? ?Date:  05/15/2021  ? ?ID:  Alicia Hogan, DOB [birth year], MRN [id number] ? ?PCP:  Maurice Small, MD  ?Cardiologist: New  ? ?No chief complaint on file. ? ? ?  ?History of Present Illness: ?Alicia Hogan is a 71 y.o.  female who presents for cardiology visit. She has not been seen since 2020  Previously seen by Dr Mare Ferrari on 06/13/14 for evaluation of PVCs and a new left bundle branch block.  She underwent Lexiscan Myoview stress test on 06/23/14.  The ejection fraction was normal at 64% and there were no perfusion deficits.  PVCls resolved with beta blocker  ? 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  She also walks in her neighborhood with her husband and they also enjoy dancing for exercise  Some activity limited by plantar fasciatus , neuropathy and L5/S1 disc disease with fusion by Dr Ellene Route 03/2017 ? ?Echo: 10/10/16 reviewed Normal EF 55-60% ?Holter: 10/14/16 reviewed SR infrequent unifocal PVCls less than 1% of total beats ? ?Had failed L5/S1 disectomy and needed a fusion with Dr Ellene Route in February2019 ?Slow to heal. 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 ?Has 3 kids doing well She goes to gym 3x/week and spends time in [place] ? ?Past Medical History:  ?Diagnosis Date  ? Arthritis   ? Cancer [facility])   ? COPD (chronic obstructive pulmonary disease) ([place])   ? from ct result  ? Dysrhythmia   ? RBB hx of  ? GERD (gastroesophageal reflux disease)   ? occ  ? Hypertension   ? Plantar fasciitis 12/28/2013  ? ? ?Past Surgical History:  ?Procedure Laterality Date  ? BACK SURGERY  11/2015  ? lumbar foraminotomy  ? COLONOSCOPY    ? HEMORRHOID SURGERY N/A 12/17/2016  ? Procedure: 3 COLUMN HEMORRHOIDECTOMY;  Surgeon: Leighton Ruff, MD;  Location: [facility];  Service: General;  Laterality: N/A;  ? L 5 to S 1 fusion  04/08/2016  ? MENISCUS REPAIR Left 2012  ? TONSILLECTOMY  as child  ? and  adenoidectomy  ? ? ? ?Current Outpatient Medications  ?Medication Sig Dispense Refill  ? aspirin 81 MG tablet Take 81 mg by mouth daily.    ? docusate sodium (COLACE) 100 MG capsule Take 100 mg by mouth daily as needed.    ? gabapentin (NEURONTIN) 300 MG capsule Take by mouth. Take 300 mg in at 4 pm and 600 mg at bedtime by mouth    ? hydrochlorothiazide (HYDRODIURIL) 25 MG tablet Take 25 mg by mouth every morning.  11  ? ibuprofen (ADVIL,MOTRIN) 200 MG tablet Take 200 mg every 6 (six) hours as needed by mouth.    ? losartan (COZAAR) 100 MG tablet Take 100 mg by mouth daily.  3  ? lovastatin (MEVACOR) 40 MG tablet Take 20 mg by mouth every evening.  2  ? metoprolol succinate (TOPROL XL) 25 MG 24 hr tablet Take 1 tablet (25 mg total) by mouth daily. 90 tablet 3  ? HYDROcodone-acetaminophen (NORCO/VICODIN) 5-325 MG tablet Take 1-2 tablets every 6 (six) hours as needed by mouth. (Patient not taking: Reported on 05/15/2021) 40 tablet 0  ? Multiple Vitamins-Minerals (MULTIVITAMIN PO) Take 1 tablet by mouth daily. (Patient not taking: Reported on 05/15/2021)    ? RYBELSUS 7 MG TABS Take 1 tablet by mouth daily.    ? ?  No current facility-administered medications for this visit.  ? ? ?Allergies:   Lisinopril, Sunscreens, and Codeine  ? ? ?Social History:  The patient  reports that she quit smoking about 13 years ago. Her smoking use included cigarettes. She started smoking about 52 years ago. She has a 43.00 pack-year smoking history. She has never used smokeless tobacco. She reports that she does not drink alcohol and does not use drugs.  ? ?Family History:  The patient's  ?family history includes Alzheimer's disease in her mother; Diabetes in her maternal grandmother; Hypertension in her mother; Lung cancer in her father; Stroke in her maternal grandmother.  ? ? ?ROS:  Please see the history of present illness.   Otherwise, review of systems are positive for none.   All other systems are reviewed and negative.  ? ? ?PHYSICAL  EXAM: ?VS:  BP 110/70   Pulse 71   Ht '5\' 6"'$  (1.676 m)   Wt 235 lb (106.6 kg)   SpO2 96%   BMI 37.93 kg/m?  , BMI Body mass index is 37.93 kg/m?. ?Affect appropriate ?Healthy:  appears stated age ?HEENT: normal ?Neck supple with no adenopathy ?JVP normal no bruits no thyromegaly ?Lungs clear with no wheezing and good diaphragmatic motion ?Heart:  S1/S2 no murmur, no rub, gallop or click ?PMI normal ?Abdomen: benighn, BS positve, no tenderness, no AAA ?no bruit.  No HSM or HJR ?Distal pulses intact with no bruits ?No edema ?Neuro non-focal ?Skin warm and dry ?No muscular weakness ? ? ? ?EKG:   ?05/15/2021 NSR LBBB chronic  ? ? ?Recent Labs: ?No results found for requested labs within last 8760 hours.  ? ? ?Lipid Panel ?No results found for: CHOL, TRIG, HDL, CHOLHDL, VLDL, LDLCALC, LDLDIRECT ?  ? ?Wt Readings from Last 3 Encounters:  ?05/15/21 235 lb (106.6 kg)  ?11/19/18 233 lb (105.7 kg)  ?12/17/16 228 lb (103.4 kg)  ?  ? ? ? ? ? ?ASSESSMENT AND PLAN: ? ?1.  Left bundle branch block, currently asymptomatic.  Myoview showed no evidence of ischemia.  She has normal left ventricular systolic function. s Lung cancer CT with 3 vessel coronary calcium stable will update calcium score  ?2.  Essential hypertension controlled on current medications ?3.  Plantar fasciitis. Continue orthotics and f/u Regal ?4. Neuropathy  qhs Neurontin stable no diabetes  ?5. Chol:  Continue statin labs with Dr Justin Mend ?6. Palpitations: benign limited PVCls on holter in setting normal EF continue beta blocker Rx ?7. Dyspnea: normal exam normal echo Former smoker with 43 pack year history Lung cancer ?Screening CT 03/27/21 ok f/u Eric Form   ? ?Echo  ?Calcium score  ? ? ?Disposition:  F/u in a year  ? ? ? ?Jenkins Rouge ? ?

## 2021-05-15 ENCOUNTER — Ambulatory Visit (INDEPENDENT_AMBULATORY_CARE_PROVIDER_SITE_OTHER): Payer: Medicare Other | Admitting: Cardiovascular Disease

## 2021-05-15 ENCOUNTER — Encounter: Payer: Self-pay | Admitting: Cardiovascular Disease

## 2021-05-15 VITALS — BP 110/70 | HR 71 | Ht 66.0 in | Wt 235.0 lb

## 2021-05-15 DIAGNOSIS — I10 Essential (primary) hypertension: Secondary | ICD-10-CM

## 2021-05-15 DIAGNOSIS — I447 Left bundle-branch block, unspecified: Secondary | ICD-10-CM | POA: Diagnosis not present

## 2021-05-15 DIAGNOSIS — E785 Hyperlipidemia, unspecified: Secondary | ICD-10-CM | POA: Diagnosis not present

## 2021-05-15 NOTE — Patient Instructions (Signed)
Medication Instructions:  ?Your physician recommends that you continue on your current medications as directed. Please refer to the Current Medication list given to you today. ? ?*If you need a refill on your cardiac medications before your next appointment, please call your pharmacy* ? ?Lab Work: ?If you have labs (blood work) drawn today and your tests are completely normal, you will receive your results only by: ?MyChart Message (if you have MyChart) OR ?A paper copy in the mail ?If you have any lab test that is abnormal or we need to change your treatment, we will call you to review the results. ? ?Testing/Procedures: ?Your physician has requested that you have an echocardiogram. Echocardiography is a painless test that uses sound waves to create images of your heart. It provides your doctor with information about the size and shape of your heart and how well your heart?s chambers and valves are working. This procedure takes approximately one hour. There are no restrictions for this procedure. ? ?CT scanning for calcium score, (CAT scanning), is a noninvasive, special x-ray that produces cross-sectional images of the body using x-rays and a computer. CT scans help physicians diagnose and treat medical conditions. For some CT exams, a contrast material is used to enhance visibility in the area of the body being studied. CT scans provide greater clarity and reveal more details than regular x-ray exams. ? ?Follow-Up: ?At [facility], you and your health needs are our priority.  As part of our continuing mission to provide you with exceptional heart care, we have created designated Provider Care Teams.  These Care Teams include your primary Cardiologist (physician) and Advanced Practice Providers (APPs -  Physician Assistants and Nurse Practitioners) who all work together to provide you with the care you need, when you need it. ? ?We recommend signing up for the patient portal called "MyChart".  Sign up  information is provided on this After Visit Summary.  MyChart is used to connect with patients for Virtual Visits (Telemedicine).  Patients are able to view lab/test results, encounter notes, upcoming appointments, etc.  Non-urgent messages can be sent to your provider as well.   ?To learn more about what you can do with MyChart, go to [email or web address].   ? ?Your next appointment:   ?1 year(s) ? ?The format for your next appointment:   ?In Person ? ?Provider:   ?Jenkins Rouge, MD { ? ?

## 2021-05-23 ENCOUNTER — Ambulatory Visit
Admission: RE | Admit: 2021-05-23 | Discharge: 2021-05-23 | Disposition: A | Discharge status: Home / Self Care | Payer: Medicare Other | Source: Ambulatory Visit | Attending: Family Medicine | Admitting: Family Medicine

## 2021-05-23 DIAGNOSIS — E2839 Other primary ovarian failure: Secondary | ICD-10-CM

## 2021-05-23 DIAGNOSIS — Z780 Asymptomatic menopausal state: Secondary | ICD-10-CM | POA: Diagnosis not present

## 2021-05-30 ENCOUNTER — Ambulatory Visit (HOSPITAL_COMMUNITY): Payer: Medicare Other | Attending: Cardiology

## 2021-05-30 ENCOUNTER — Ambulatory Visit (INDEPENDENT_AMBULATORY_CARE_PROVIDER_SITE_OTHER)
Admission: RE | Admit: 2021-05-30 | Discharge: 2021-05-30 | Disposition: A | Discharge status: Home / Self Care | Payer: Self-pay | Source: Ambulatory Visit | Attending: Cardiovascular Disease | Admitting: Cardiovascular Disease

## 2021-05-30 DIAGNOSIS — E785 Hyperlipidemia, unspecified: Secondary | ICD-10-CM | POA: Diagnosis not present

## 2021-05-30 DIAGNOSIS — I10 Essential (primary) hypertension: Secondary | ICD-10-CM

## 2021-05-30 DIAGNOSIS — I447 Left bundle-branch block, unspecified: Secondary | ICD-10-CM | POA: Diagnosis not present

## 2021-05-30 LAB — ECHOCARDIOGRAM COMPLETE
Area-P 1/2: 3.6 cm2
S' Lateral: 2.9 cm

## 2021-05-31 ENCOUNTER — Telehealth: Payer: Self-pay

## 2021-05-31 DIAGNOSIS — I25119 Atherosclerotic heart disease of native coronary artery with unspecified angina pectoris: Secondary | ICD-10-CM

## 2021-05-31 DIAGNOSIS — E785 Hyperlipidemia, unspecified: Secondary | ICD-10-CM

## 2021-05-31 DIAGNOSIS — I10 Essential (primary) hypertension: Secondary | ICD-10-CM

## 2021-05-31 DIAGNOSIS — R931 Abnormal findings on diagnostic imaging of heart and coronary circulation: Secondary | ICD-10-CM

## 2021-05-31 NOTE — Telephone Encounter (Signed)
Called patient with Calcium score results. Will place order for test. Patient stated she had lab work done with her PCP in December. Left message for PCP to call back to get lab work faxed to office. Will call patient back with an office visit after stress test has been scheduled. ?

## 2021-05-31 NOTE — Telephone Encounter (Signed)
-----   Message from Josue Hector, MD sent at 05/30/2021  5:03 PM EDT ----- ?Calcium score very high 1389 , 99 th percentile Needs updated lipids and lexiscan myovue with f/u after stress test  ?

## 2021-06-28 DIAGNOSIS — K649 Unspecified hemorrhoids: Secondary | ICD-10-CM | POA: Diagnosis not present

## 2021-06-28 DIAGNOSIS — Z86010 Personal history of colonic polyps: Secondary | ICD-10-CM | POA: Diagnosis not present

## 2021-06-28 DIAGNOSIS — D125 Benign neoplasm of sigmoid colon: Secondary | ICD-10-CM | POA: Diagnosis not present

## 2021-06-28 DIAGNOSIS — K5730 Diverticulosis of large intestine without perforation or abscess without bleeding: Secondary | ICD-10-CM | POA: Diagnosis not present

## 2021-07-02 DIAGNOSIS — D125 Benign neoplasm of sigmoid colon: Secondary | ICD-10-CM | POA: Diagnosis not present

## 2021-07-03 DIAGNOSIS — E119 Type 2 diabetes mellitus without complications: Secondary | ICD-10-CM | POA: Diagnosis not present

## 2021-07-03 DIAGNOSIS — L304 Erythema intertrigo: Secondary | ICD-10-CM | POA: Diagnosis not present

## 2021-07-03 DIAGNOSIS — L503 Dermatographic urticaria: Secondary | ICD-10-CM | POA: Diagnosis not present

## 2021-07-03 DIAGNOSIS — L821 Other seborrheic keratosis: Secondary | ICD-10-CM | POA: Diagnosis not present

## 2021-07-03 DIAGNOSIS — L578 Other skin changes due to chronic exposure to nonionizing radiation: Secondary | ICD-10-CM | POA: Diagnosis not present

## 2021-07-03 DIAGNOSIS — H2513 Age-related nuclear cataract, bilateral: Secondary | ICD-10-CM | POA: Diagnosis not present

## 2021-07-03 DIAGNOSIS — Z808 Family history of malignant neoplasm of other organs or systems: Secondary | ICD-10-CM | POA: Diagnosis not present

## 2021-07-03 DIAGNOSIS — H43813 Vitreous degeneration, bilateral: Secondary | ICD-10-CM | POA: Diagnosis not present

## 2021-07-03 DIAGNOSIS — L739 Follicular disorder, unspecified: Secondary | ICD-10-CM | POA: Diagnosis not present

## 2021-07-03 DIAGNOSIS — H5000 Unspecified esotropia: Secondary | ICD-10-CM | POA: Diagnosis not present

## 2021-07-03 DIAGNOSIS — D225 Melanocytic nevi of trunk: Secondary | ICD-10-CM | POA: Diagnosis not present

## 2021-07-18 ENCOUNTER — Telehealth (HOSPITAL_COMMUNITY): Payer: Self-pay

## 2021-07-18 NOTE — Telephone Encounter (Signed)
Spoke with the patient, detailed instructions given. She stated that she would be here for her test. Asked to call back with any questions. S.Alissia Lory EMTP 

## 2021-07-23 ENCOUNTER — Ambulatory Visit (HOSPITAL_COMMUNITY): Payer: Medicare Other | Attending: Cardiology

## 2021-07-23 DIAGNOSIS — R931 Abnormal findings on diagnostic imaging of heart and coronary circulation: Secondary | ICD-10-CM | POA: Diagnosis not present

## 2021-07-23 DIAGNOSIS — I25119 Atherosclerotic heart disease of native coronary artery with unspecified angina pectoris: Secondary | ICD-10-CM | POA: Insufficient documentation

## 2021-07-23 DIAGNOSIS — I10 Essential (primary) hypertension: Secondary | ICD-10-CM | POA: Diagnosis not present

## 2021-07-23 DIAGNOSIS — E785 Hyperlipidemia, unspecified: Secondary | ICD-10-CM | POA: Insufficient documentation

## 2021-07-23 LAB — MYOCARDIAL PERFUSION IMAGING
LV dias vol: 55 mL (ref 46–106)
LV sys vol: 13 mL
Nuc Stress EF: 76 %
Peak HR: 82 {beats}/min
Rest HR: 66 {beats}/min
Rest Nuclear Isotope Dose: 10.4 mCi
SDS: 0
SRS: 0
SSS: 0
ST Depression (mm): 0 mm
Stress Nuclear Isotope Dose: 30.9 mCi
TID: 1.11

## 2021-07-23 MED ORDER — TECHNETIUM TC 99M TETROFOSMIN IV KIT
30.9000 | PACK | Freq: Once | INTRAVENOUS | Status: AC | PRN
  Administered 2021-07-23: 30.9 via INTRAVENOUS

## 2021-07-23 MED ORDER — TECHNETIUM TC 99M TETROFOSMIN IV KIT
10.4000 | PACK | Freq: Once | INTRAVENOUS | Status: AC | PRN
  Administered 2021-07-23: 10.4 via INTRAVENOUS

## 2021-07-23 MED ORDER — REGADENOSON 0.4 MG/5ML IV SOLN
0.4000 mg | Freq: Once | INTRAVENOUS | Status: AC
  Administered 2021-07-23: 0.4 mg via INTRAVENOUS

## 2021-07-24 ENCOUNTER — Ambulatory Visit (HOSPITAL_COMMUNITY): Payer: Medicare Other

## 2021-07-24 DIAGNOSIS — I700 Atherosclerosis of aorta: Secondary | ICD-10-CM | POA: Diagnosis not present

## 2021-07-24 DIAGNOSIS — E1169 Type 2 diabetes mellitus with other specified complication: Secondary | ICD-10-CM | POA: Diagnosis not present

## 2021-07-24 DIAGNOSIS — E785 Hyperlipidemia, unspecified: Secondary | ICD-10-CM | POA: Diagnosis not present

## 2021-07-24 DIAGNOSIS — I2510 Atherosclerotic heart disease of native coronary artery without angina pectoris: Secondary | ICD-10-CM | POA: Diagnosis not present

## 2021-07-25 DIAGNOSIS — E785 Hyperlipidemia, unspecified: Secondary | ICD-10-CM | POA: Diagnosis not present

## 2021-07-25 DIAGNOSIS — E1169 Type 2 diabetes mellitus with other specified complication: Secondary | ICD-10-CM | POA: Diagnosis not present

## 2021-08-05 ENCOUNTER — Ambulatory Visit: Payer: Medicare Other | Admitting: Nurse Practitioner

## 2021-09-05 ENCOUNTER — Other Ambulatory Visit: Payer: Self-pay | Admitting: Family Medicine

## 2021-09-05 DIAGNOSIS — R921 Mammographic calcification found on diagnostic imaging of breast: Secondary | ICD-10-CM

## 2021-09-16 DIAGNOSIS — Z23 Encounter for immunization: Secondary | ICD-10-CM | POA: Diagnosis not present

## 2021-10-03 ENCOUNTER — Ambulatory Visit
Admission: RE | Admit: 2021-10-03 | Discharge: 2021-10-03 | Disposition: A | Discharge status: Home / Self Care | Payer: Medicare Other | Source: Ambulatory Visit | Attending: Family Medicine | Admitting: Family Medicine

## 2021-10-03 DIAGNOSIS — R921 Mammographic calcification found on diagnostic imaging of breast: Secondary | ICD-10-CM

## 2021-10-08 DIAGNOSIS — R051 Acute cough: Secondary | ICD-10-CM | POA: Diagnosis not present

## 2021-10-08 DIAGNOSIS — J0110 Acute frontal sinusitis, unspecified: Secondary | ICD-10-CM | POA: Diagnosis not present

## 2021-10-08 DIAGNOSIS — Z03818 Encounter for observation for suspected exposure to other biological agents ruled out: Secondary | ICD-10-CM | POA: Diagnosis not present

## 2021-11-15 DIAGNOSIS — Z23 Encounter for immunization: Secondary | ICD-10-CM | POA: Diagnosis not present

## 2021-12-03 DIAGNOSIS — L301 Dyshidrosis [pompholyx]: Secondary | ICD-10-CM | POA: Diagnosis not present

## 2022-01-14 NOTE — Progress Notes (Signed)
Cardiology Office Note   Date:  01/23/2022   ID:  Alicia Hogan 1950/04/12, MRN [id number]  PCP:  Jonathon Jordan, MD  Cardiologist: Johnsie Cancel       History of Present Illness: Alicia Hogan is a 71 y.o.  female who presents for f/u First seen 05/15/21 after 3 year absence Previously seen by Dr Mare Ferrari on 06/13/14 for evaluation of PVCs and a new left bundle branch block.  She underwent Lexiscan Myoview stress test on 06/23/14.  The ejection fraction was normal at 64% and there were no perfusion deficits.  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  She tries to walk up the stairs at the hospital for exercise.  She also walks in her neighborhood with her husband and they also enjoy dancing for exercise  Some activity limited by plantar fasciatus , neuropathy and L5/S1 disc disease with fusion by Dr Ellene Route 03/2017  Echo: 10/10/16 reviewed Normal EF 55-60% Holter: 10/14/16 reviewed SR infrequent unifocal PVCls less than 1% of total beats  Had failed L5/S1 disectomy and needed a fusion with Dr Ellene Route in February2019  Has 3 kids doing well She goes to gym 3x/week and spends time in [place]  TTE 05/30/21 EF 55-60% no significant valve dx Calcium Score 05/30/21 total 1387 99 th percentile LM/3 vessel Myovue 07/23/21 normal no ischemia EF 69%   She is on Mevacor with LDL 69  Some dizziness when getting up from floor with head between her legs Not postural in office By me with systolic BP 944 mmHg standing and sitting   Past Medical History:  Diagnosis Date   Arthritis    Cancer ([place])    COPD (chronic obstructive pulmonary disease) ([place])    from ct result   Dysrhythmia    RBB hx of   GERD (gastroesophageal reflux disease)    occ   Hypertension    Plantar fasciitis 12/28/2013    Past Surgical History:  Procedure Laterality Date   BACK SURGERY  11/2015   lumbar foraminotomy   COLONOSCOPY      HEMORRHOID SURGERY N/A 12/17/2016   Procedure: 3 COLUMN HEMORRHOIDECTOMY;  Surgeon: Leighton Ruff, MD;  Location: [place];  Service: General;  Laterality: N/A;   L 5 to S 1 fusion  04/08/2016   MENISCUS REPAIR Left 2012   TONSILLECTOMY  as child   and adenoidectomy     Current Outpatient Medications  Medication Sig Dispense Refill   aspirin 81 MG tablet Take 81 mg by mouth daily.     betamethasone dipropionate 0.05 % cream Apply topically as needed.     gabapentin (NEURONTIN) 300 MG capsule Take by mouth. Take 300 mg in at 4 pm and 600 mg at bedtime by mouth     hydrochlorothiazide (HYDRODIURIL) 25 MG tablet Take 25 mg by mouth every morning.  11   losartan (COZAAR) 100 MG tablet Take 100 mg by mouth daily.  3   lovastatin (MEVACOR) 40 MG tablet Take 20 mg by mouth every evening.  2   metoprolol succinate (TOPROL XL) 25 MG 24 hr tablet Take 1 tablet (25 mg total) by mouth daily. 90 tablet 3   omeprazole (PRILOSEC OTC) 20 MG tablet Take 20 mg by mouth daily.     RYBELSUS 14 MG TABS Take 1 tablet by mouth every morning.     No current facility-administered medications for  this visit.    Allergies:   Lisinopril, Nickel, Sunscreens, and Codeine    Social History:  The patient  reports that she quit smoking about 13 years ago. Her smoking use included cigarettes. She started smoking about 52 years ago. She has a 43.00 pack-year smoking history. She has never used smokeless tobacco. She reports that she does not drink alcohol and does not use drugs.   Family History:  The patient's  family history includes Alzheimer's disease in her mother; Diabetes in her maternal grandmother; Hypertension in her mother; Lung cancer in her father; Stroke in her maternal grandmother.    ROS:  Please see the history of present illness.   Otherwise, review of systems are positive for none.   All other systems are reviewed and negative.    PHYSICAL EXAM: VS:  BP 124/68   Pulse 79   Ht  '5\' 6"'$  (1.676 m)   Wt 222 lb 9.6 oz (101 kg)   SpO2 97%   BMI 35.93 kg/m  , BMI Body mass index is 35.93 kg/m. Affect appropriate Healthy:  appears stated age 76: normal Neck supple with no adenopathy JVP normal no bruits no thyromegaly Lungs clear with no wheezing and good diaphragmatic motion Heart:  S1/S2 no murmur, no rub, gallop or click PMI normal Abdomen: benighn, BS positve, no tenderness, no AAA no bruit.  No HSM or HJR Distal pulses intact with no bruits No edema Neuro non-focal Skin warm and dry No muscular weakness    EKG:   01/23/2022 NSR LBBB chronic    Recent Labs: No results found for requested labs within last 365 days.    Lipid Panel No results found for: "CHOL", "TRIG", "HDL", "CHOLHDL", "VLDL", "LDLCALC", "LDLDIRECT"    Wt Readings from Last 3 Encounters:  01/23/22 222 lb 9.6 oz (101 kg)  07/23/21 235 lb (106.6 kg)  05/15/21 235 lb (106.6 kg)         ASSESSMENT AND PLAN:  1.  Left bundle branch block, currently asymptomatic.  No high grade heart block Yearly ECG  2.  Essential hypertension controlled on current medications 3.  Plantar fasciitis. Continue orthotics and f/u Regal 4. Neuropathy  qhs Neurontin stable no diabetes  5. Chol:  Continue statin labs with Dr Justin Mend on statin LDL 69 6. Palpitations: benign limited PVCls on holter in setting normal EF continue beta blocker Rx 7. Dyspnea: normal exam normal echo Former smoker with 43 pack year history Lung cancer Screening CT 03/27/21 ok f/u Eric Form   8. CAD: subclinical high calcium score normal Lexiscan myovue 07/23/21 with no ischemia   ASA, statin and beta blocker   SL nitrol called in    Disposition:  F/u in a year     Jenkins Rouge

## 2022-01-23 ENCOUNTER — Ambulatory Visit: Payer: Medicare Other | Attending: Cardiovascular Disease | Admitting: Cardiovascular Disease

## 2022-01-23 ENCOUNTER — Encounter: Payer: Self-pay | Admitting: Cardiovascular Disease

## 2022-01-23 VITALS — BP 124/68 | HR 79 | Ht 66.0 in | Wt 222.6 lb

## 2022-01-23 DIAGNOSIS — R931 Abnormal findings on diagnostic imaging of heart and coronary circulation: Secondary | ICD-10-CM

## 2022-01-23 DIAGNOSIS — I447 Left bundle-branch block, unspecified: Secondary | ICD-10-CM | POA: Insufficient documentation

## 2022-01-23 DIAGNOSIS — I10 Essential (primary) hypertension: Secondary | ICD-10-CM | POA: Insufficient documentation

## 2022-01-23 NOTE — Patient Instructions (Signed)
Medication Instructions:  Your physician recommends that you continue on your current medications as directed. Please refer to the Current Medication list given to you today.  *If you need a refill on your cardiac medications before your next appointment, please call your pharmacy*  Lab Work: If you have labs (blood work) drawn today and your tests are completely normal, you will receive your results only by: MyChart Message (if you have MyChart) OR A paper copy in the mail If you have any lab test that is abnormal or we need to change your treatment, we will call you to review the results.  Testing/Procedures: None ordered today.  Follow-Up: At [facility], you and your health needs are our priority.  As part of our continuing mission to provide you with exceptional heart care, we have created designated Provider Care Teams.  These Care Teams include your primary Cardiologist (physician) and Advanced Practice Providers (APPs -  Physician Assistants and Nurse Practitioners) who all work together to provide you with the care you need, when you need it.  We recommend signing up for the patient portal called "MyChart".  Sign up information is provided on this After Visit Summary.  MyChart is used to connect with patients for Virtual Visits (Telemedicine).  Patients are able to view lab/test results, encounter notes, upcoming appointments, etc.  Non-urgent messages can be sent to your provider as well.   To learn more about what you can do with MyChart, go to [email or web address].    Your next appointment:   1 year(s)  The format for your next appointment:   In Person  Provider:   Peter Nishan, MD     Important Information About Sugar       

## 2022-01-27 DIAGNOSIS — Z23 Encounter for immunization: Secondary | ICD-10-CM | POA: Diagnosis not present

## 2022-02-07 DIAGNOSIS — K5730 Diverticulosis of large intestine without perforation or abscess without bleeding: Secondary | ICD-10-CM | POA: Diagnosis not present

## 2022-02-07 DIAGNOSIS — I447 Left bundle-branch block, unspecified: Secondary | ICD-10-CM | POA: Diagnosis not present

## 2022-02-07 DIAGNOSIS — K219 Gastro-esophageal reflux disease without esophagitis: Secondary | ICD-10-CM | POA: Diagnosis not present

## 2022-02-07 DIAGNOSIS — I700 Atherosclerosis of aorta: Secondary | ICD-10-CM | POA: Diagnosis not present

## 2022-02-07 DIAGNOSIS — Z79899 Other long term (current) drug therapy: Secondary | ICD-10-CM | POA: Diagnosis not present

## 2022-02-07 DIAGNOSIS — Z0000 Encounter for general adult medical examination without abnormal findings: Secondary | ICD-10-CM | POA: Diagnosis not present

## 2022-02-07 DIAGNOSIS — E1169 Type 2 diabetes mellitus with other specified complication: Secondary | ICD-10-CM | POA: Diagnosis not present

## 2022-02-07 DIAGNOSIS — I10 Essential (primary) hypertension: Secondary | ICD-10-CM | POA: Diagnosis not present

## 2022-02-07 DIAGNOSIS — K760 Fatty (change of) liver, not elsewhere classified: Secondary | ICD-10-CM | POA: Diagnosis not present

## 2022-02-07 DIAGNOSIS — E785 Hyperlipidemia, unspecified: Secondary | ICD-10-CM | POA: Diagnosis not present

## 2022-02-07 DIAGNOSIS — J432 Centrilobular emphysema: Secondary | ICD-10-CM | POA: Diagnosis not present

## 2022-02-07 DIAGNOSIS — I2510 Atherosclerotic heart disease of native coronary artery without angina pectoris: Secondary | ICD-10-CM | POA: Diagnosis not present

## 2022-02-21 ENCOUNTER — Other Ambulatory Visit: Payer: Self-pay | Admitting: *Deleted

## 2022-02-21 DIAGNOSIS — Z122 Encounter for screening for malignant neoplasm of respiratory organs: Secondary | ICD-10-CM

## 2022-02-21 DIAGNOSIS — Z87891 Personal history of nicotine dependence: Secondary | ICD-10-CM

## 2022-03-28 ENCOUNTER — Other Ambulatory Visit: Payer: Medicare Other

## 2022-04-04 ENCOUNTER — Encounter: Payer: Self-pay | Admitting: Family Medicine

## 2022-04-04 DIAGNOSIS — Z122 Encounter for screening for malignant neoplasm of respiratory organs: Secondary | ICD-10-CM

## 2022-05-20 ENCOUNTER — Ambulatory Visit
Admission: RE | Admit: 2022-05-20 | Discharge: 2022-05-20 | Disposition: A | Discharge status: Home / Self Care | Payer: Medicare Other | Source: Ambulatory Visit | Attending: Acute Care | Admitting: Acute Care

## 2022-05-20 DIAGNOSIS — Z122 Encounter for screening for malignant neoplasm of respiratory organs: Secondary | ICD-10-CM

## 2022-05-20 DIAGNOSIS — J432 Centrilobular emphysema: Secondary | ICD-10-CM | POA: Diagnosis not present

## 2022-05-20 DIAGNOSIS — Z87891 Personal history of nicotine dependence: Secondary | ICD-10-CM | POA: Diagnosis not present

## 2022-05-20 DIAGNOSIS — I2510 Atherosclerotic heart disease of native coronary artery without angina pectoris: Secondary | ICD-10-CM | POA: Diagnosis not present

## 2022-05-20 DIAGNOSIS — I700 Atherosclerosis of aorta: Secondary | ICD-10-CM | POA: Diagnosis not present

## 2022-05-22 ENCOUNTER — Other Ambulatory Visit: Payer: Self-pay | Admitting: Acute Care

## 2022-05-22 DIAGNOSIS — Z122 Encounter for screening for malignant neoplasm of respiratory organs: Secondary | ICD-10-CM

## 2022-05-22 DIAGNOSIS — Z87891 Personal history of nicotine dependence: Secondary | ICD-10-CM

## 2022-07-04 DIAGNOSIS — W57XXXA Bitten or stung by nonvenomous insect and other nonvenomous arthropods, initial encounter: Secondary | ICD-10-CM | POA: Diagnosis not present

## 2022-07-04 DIAGNOSIS — L739 Follicular disorder, unspecified: Secondary | ICD-10-CM | POA: Diagnosis not present

## 2022-07-04 DIAGNOSIS — L281 Prurigo nodularis: Secondary | ICD-10-CM | POA: Diagnosis not present

## 2022-07-04 DIAGNOSIS — Z86018 Personal history of other benign neoplasm: Secondary | ICD-10-CM | POA: Diagnosis not present

## 2022-07-04 DIAGNOSIS — D225 Melanocytic nevi of trunk: Secondary | ICD-10-CM | POA: Diagnosis not present

## 2022-07-04 DIAGNOSIS — L570 Actinic keratosis: Secondary | ICD-10-CM | POA: Diagnosis not present

## 2022-07-04 DIAGNOSIS — L578 Other skin changes due to chronic exposure to nonionizing radiation: Secondary | ICD-10-CM | POA: Diagnosis not present

## 2022-07-04 DIAGNOSIS — L821 Other seborrheic keratosis: Secondary | ICD-10-CM | POA: Diagnosis not present

## 2022-07-09 DIAGNOSIS — H52203 Unspecified astigmatism, bilateral: Secondary | ICD-10-CM | POA: Diagnosis not present

## 2022-07-09 DIAGNOSIS — H5000 Unspecified esotropia: Secondary | ICD-10-CM | POA: Diagnosis not present

## 2022-07-09 DIAGNOSIS — H524 Presbyopia: Secondary | ICD-10-CM | POA: Diagnosis not present

## 2022-07-09 DIAGNOSIS — E119 Type 2 diabetes mellitus without complications: Secondary | ICD-10-CM | POA: Diagnosis not present

## 2022-07-09 DIAGNOSIS — H2513 Age-related nuclear cataract, bilateral: Secondary | ICD-10-CM | POA: Diagnosis not present

## 2022-09-05 DIAGNOSIS — H6090 Unspecified otitis externa, unspecified ear: Secondary | ICD-10-CM | POA: Diagnosis not present

## 2022-09-05 DIAGNOSIS — I10 Essential (primary) hypertension: Secondary | ICD-10-CM | POA: Diagnosis not present

## 2022-09-17 ENCOUNTER — Other Ambulatory Visit: Payer: Self-pay | Admitting: Family Medicine

## 2022-09-17 DIAGNOSIS — Z1231 Encounter for screening mammogram for malignant neoplasm of breast: Secondary | ICD-10-CM

## 2022-10-10 ENCOUNTER — Ambulatory Visit
Admission: RE | Admit: 2022-10-10 | Discharge: 2022-10-10 | Disposition: A | Discharge status: Home / Self Care | Payer: Medicare Other | Source: Ambulatory Visit | Attending: Family Medicine | Admitting: Family Medicine

## 2022-10-10 DIAGNOSIS — Z1231 Encounter for screening mammogram for malignant neoplasm of breast: Secondary | ICD-10-CM

## 2022-10-27 DIAGNOSIS — Z23 Encounter for immunization: Secondary | ICD-10-CM | POA: Diagnosis not present

## 2022-12-20 IMAGING — CT CT CHEST LUNG CANCER SCREENING LOW DOSE W/O CM
2 of 5 series · 14 of 40 positions shown, 17 images · non-contrast
Comparison: 01/19/2020 screening chest CT.

CLINICAL DATA: 70-year-old asymptomatic female former smoker with
43 pack-year smoking history.



[Series 4: lung 1.00 br44 cor · coronal · 0.53mm/px · 3 of 350 slices shown]
[im 70/350  lung]
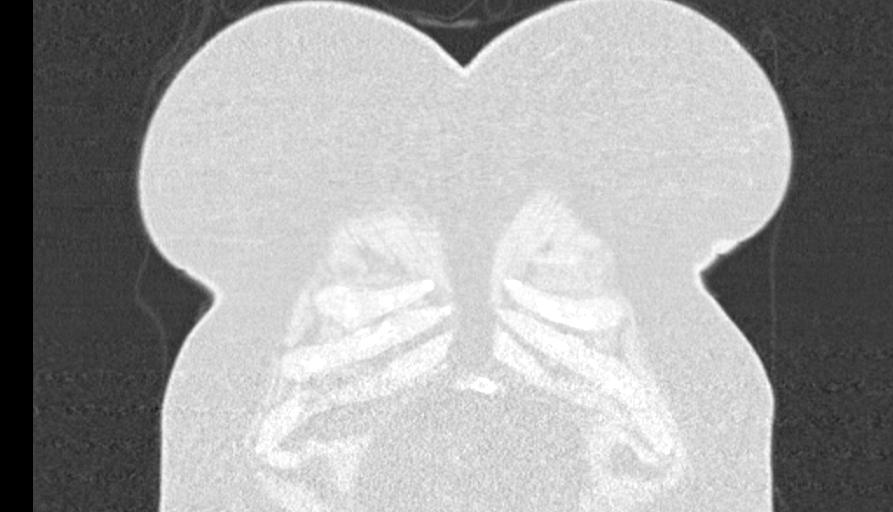
[im 140/350  lung]
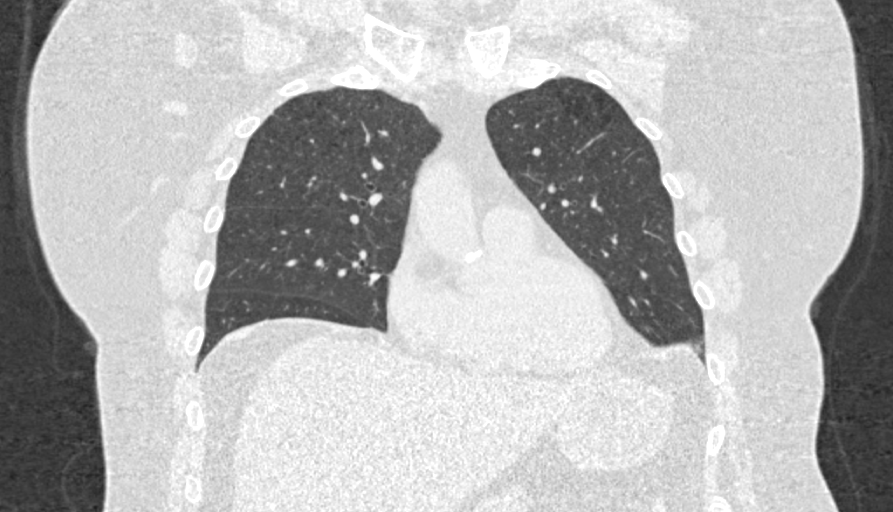
[im 210/350  lung]
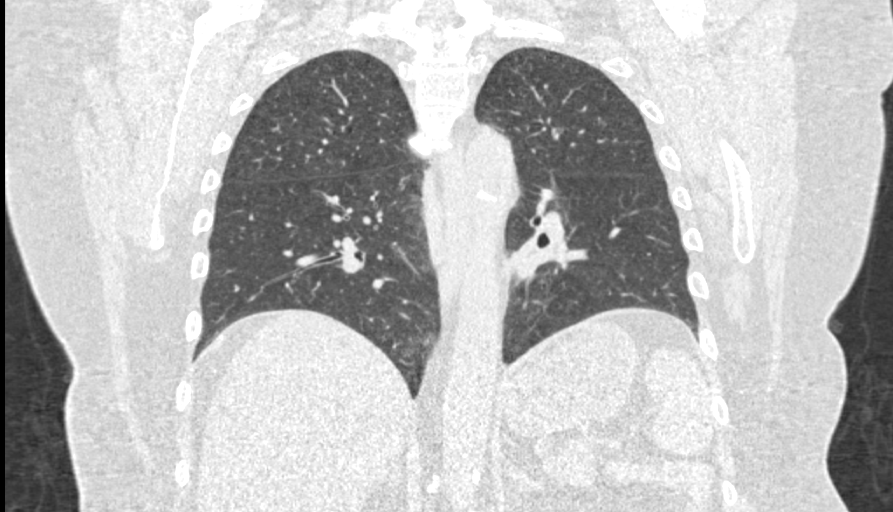

[Series 9: lung 1.00 br60 axial · axial · 0.70mm/px · z∈[-1009,-764]mm · 11 of 271 slices shown, 14 images]
[im 13/271  mediastinal]
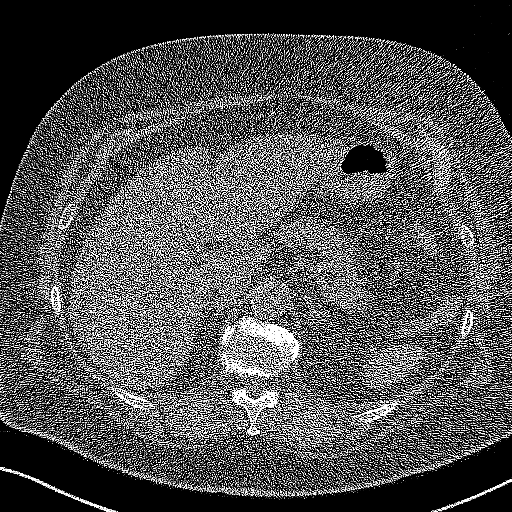
[im 13/271  lung]
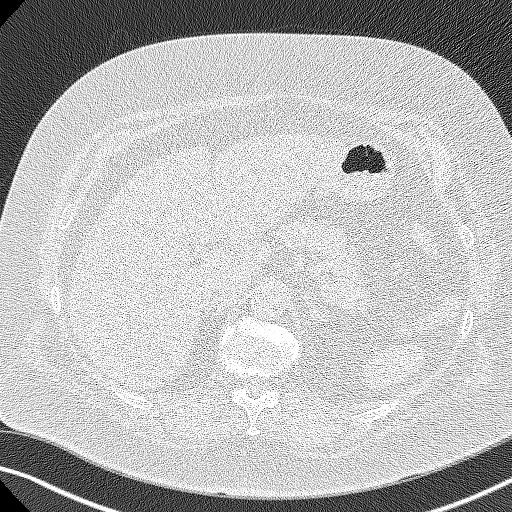
[im 37/271  lung]
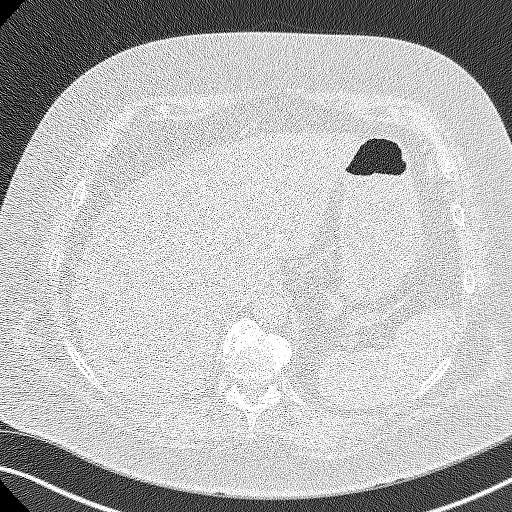
[im 62/271  lung]
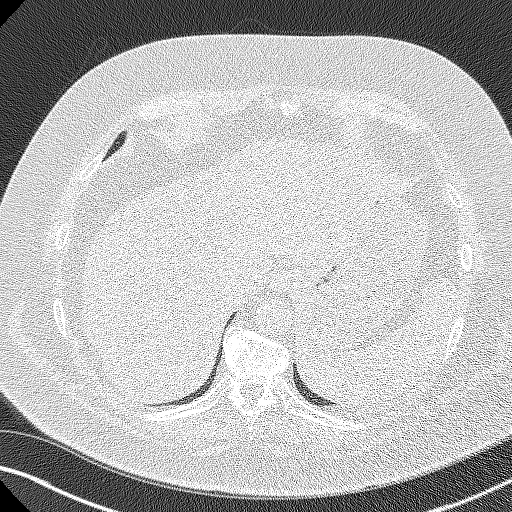
[im 86/271  lung]
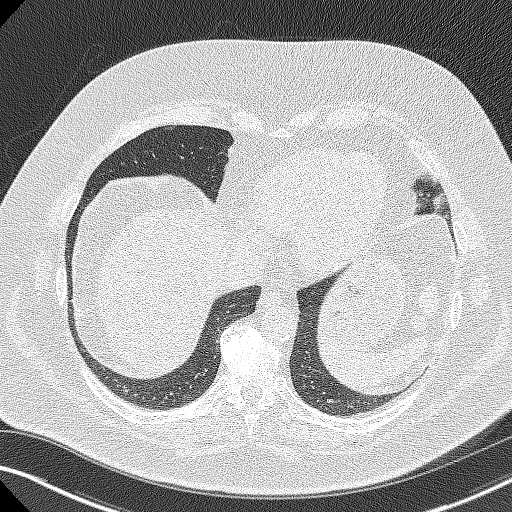
[im 111/271  mediastinal]
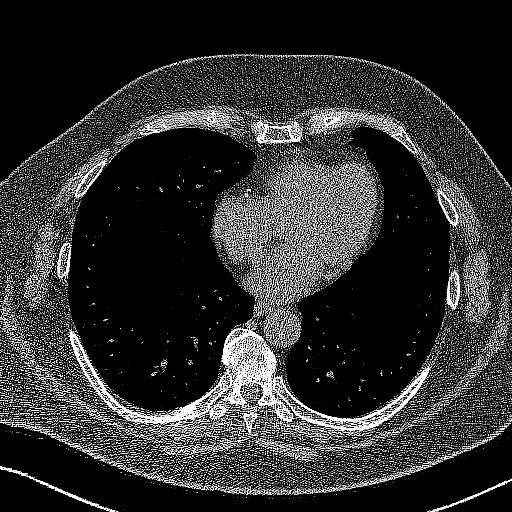
[im 111/271  lung]
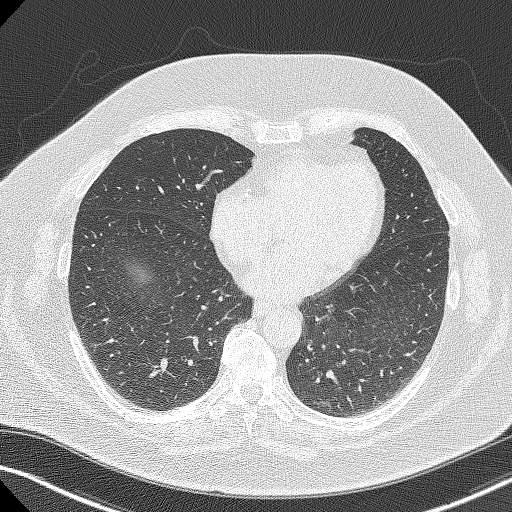
[im 136/271  lung]
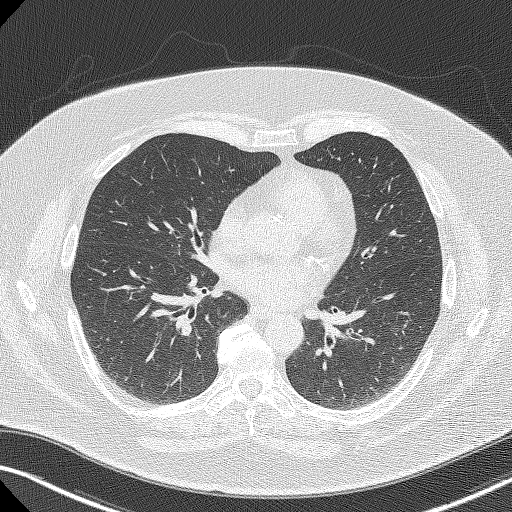
[im 160/271  lung]
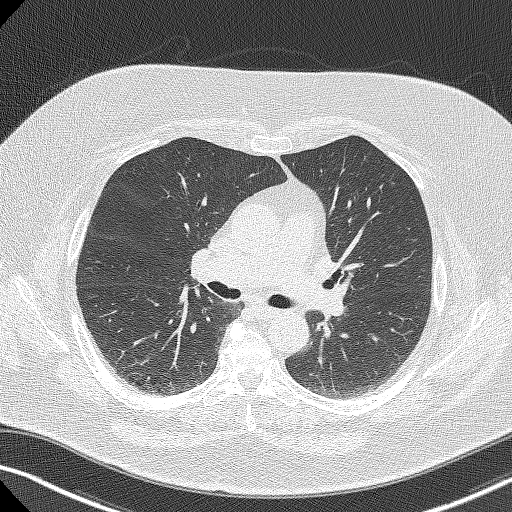
[im 185/271  lung]
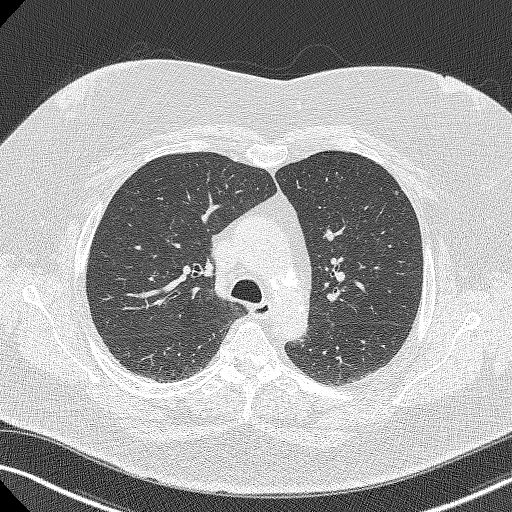
[im 209/271  mediastinal]
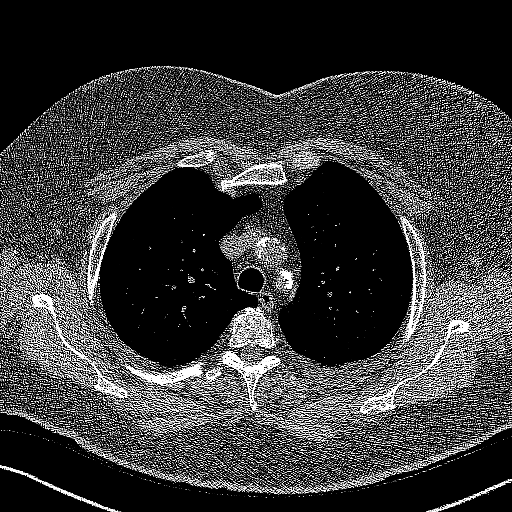
[im 209/271  lung]
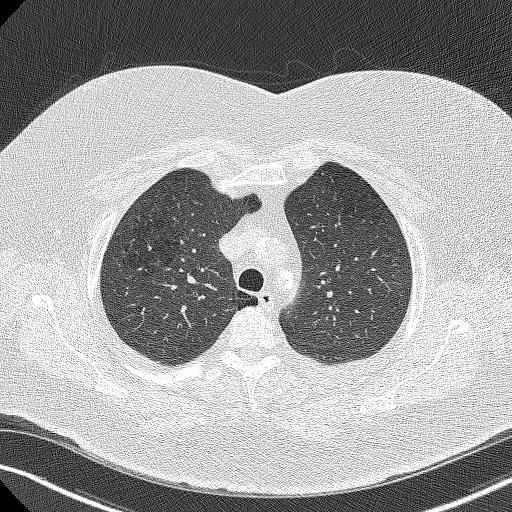
[im 234/271  lung]
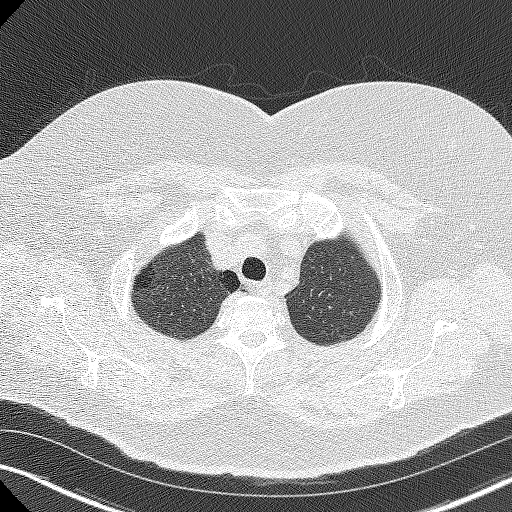
[im 258/271  lung]
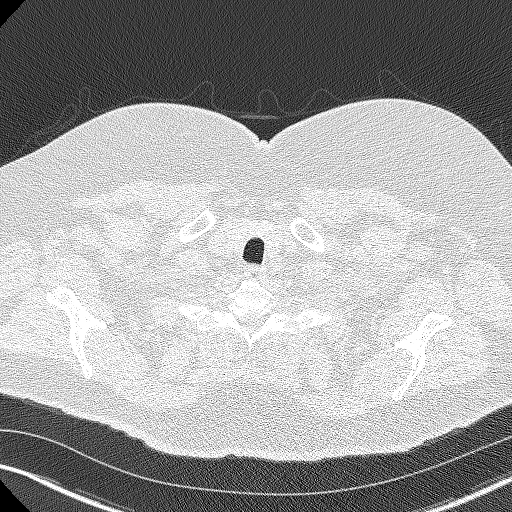

[14 of 40 positions shown; findings below may reference images not displayed]

FINDINGS: Cardiovascular: Normal heart size. No significant pericardial
effusion/thickening. Three-vessel coronary atherosclerosis.
Atherosclerotic nonaneurysmal thoracic aorta. Normal caliber
pulmonary arteries.

Mediastinum/Nodes: No discrete thyroid nodules. Unremarkable
esophagus. No pathologically enlarged axillary, mediastinal or hilar
lymph nodes, noting limited sensitivity for the detection of hilar
adenopathy on this noncontrast study.

Lungs/Pleura: No pneumothorax. No pleural effusion. Mild paraseptal
and centrilobular emphysema with diffuse bronchial wall thickening.
No acute consolidative airspace disease or lung masses. No
significant growth of previously visualized pulmonary nodules. No
new significant pulmonary nodules.

Upper abdomen: Diffuse hepatic steatosis.

Musculoskeletal: No aggressive appearing focal osseous lesions.
Marked thoracic spondylosis.
IMPRESSION: 1. Lung-RADS 2, benign appearance or behavior. Continue annual
screening with low-dose chest CT without contrast in 12 months.
2. Three-vessel coronary atherosclerosis.
3. Diffuse hepatic steatosis.
4. Aortic Atherosclerosis (PTNRU-LDE.E) and Emphysema (PTNRU-NUY.P).

## 2023-02-03 DIAGNOSIS — Z0000 Encounter for general adult medical examination without abnormal findings: Secondary | ICD-10-CM | POA: Diagnosis not present

## 2023-02-03 DIAGNOSIS — E785 Hyperlipidemia, unspecified: Secondary | ICD-10-CM | POA: Diagnosis not present

## 2023-02-03 DIAGNOSIS — Z79899 Other long term (current) drug therapy: Secondary | ICD-10-CM | POA: Diagnosis not present

## 2023-02-03 DIAGNOSIS — E1169 Type 2 diabetes mellitus with other specified complication: Secondary | ICD-10-CM | POA: Diagnosis not present

## 2023-02-03 DIAGNOSIS — D649 Anemia, unspecified: Secondary | ICD-10-CM | POA: Diagnosis not present

## 2023-02-03 DIAGNOSIS — Z1331 Encounter for screening for depression: Secondary | ICD-10-CM | POA: Diagnosis not present

## 2023-02-03 LAB — LAB REPORT - SCANNED
A1c: 6
Creatinine, POC: 136 mg/dL
EGFR: 54
Microalb Creat Ratio: <5.1
Microalbumin, Urine: <0.7

## 2023-02-17 NOTE — Progress Notes (Signed)
 Cardiology Office Note   Date:  02/25/2023   ID:  Alicia Hogan, DOB December 02, 1950, MRN [id number]  PCP:  Olin Bertin, MD  Cardiologist: Stann Earnest       History of Present Illness: Alicia Hogan is a 73 y.o.  female who presents for f/u First seen 05/15/21 after 3 year absence Previously seen by Dr Santiago Cuff on 06/13/14 for evaluation of PVCs and a new left bundle branch block.  She underwent Lexiscan  Myoview  stress test on 06/23/14.  The ejection fraction was normal at 64% and there were no perfusion deficits.  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  She tries to walk up the stairs at the hospital for exercise.  She also walks in her neighborhood with her husband and they also enjoy dancing for exercise  Some activity limited by plantar fasciatus , neuropathy and L5/S1 disc disease with fusion by Dr Ellery Guthrie 03/2017  Echo: 10/10/16 reviewed Normal EF 55-60% Holter: 10/14/16 reviewed SR infrequent unifocal PVCls less than 1% of total beats  Had failed L5/S1 disectomy and needed a fusion with Dr Ellery Guthrie in February2019  Has 3 kids doing well She goes to gym 3x/week and spends time in [place]  TTE 05/30/21 EF 55-60% no significant valve dx Calcium Score 05/30/21 total 1387 99 th percentile LM/3 vessel Myovue 07/23/21 normal no ischemia EF 69%   She is on Mevacor with LDL 73  Some dizziness when getting up from floor with head between her legs Not postural in office By me with systolic BP 120 mmHg standing and sitting   Husband had a perforated bowel last February in [place] with prolonged recovery including colostomy with reversal and hernia repair Seeing Dr Connell Degree locally  Past Medical History:  Diagnosis Date   Arthritis    Cancer [facility])    COPD (chronic obstructive pulmonary disease) ([facility])    from ct result   Dysrhythmia    RBB hx of   GERD (gastroesophageal reflux disease)    occ   Hypertension     Plantar fasciitis 12/28/2013    Past Surgical History:  Procedure Laterality Date   BACK SURGERY  11/2015   lumbar foraminotomy   COLONOSCOPY     HEMORRHOID SURGERY N/A 12/17/2016   Procedure: 3 COLUMN HEMORRHOIDECTOMY;  Surgeon: Joyce Nixon, MD;  Location: [facility];  Service: General;  Laterality: N/A;   L 5 to S 1 fusion  04/08/2016   MENISCUS REPAIR Left 2012   TONSILLECTOMY  as child   and adenoidectomy     Current Outpatient Medications  Medication Sig Dispense Refill   aspirin 81 MG tablet Take 81 mg by mouth daily.     betamethasone dipropionate 0.05 % cream Apply topically as needed.     gabapentin  (NEURONTIN ) 300 MG capsule Take by mouth. Take 300 mg in at 4 pm and 600 mg at bedtime by mouth     hydrochlorothiazide (HYDRODIURIL) 25 MG tablet Take 25 mg by mouth every morning.  11   losartan  (COZAAR ) 100 MG tablet Take 100 mg by mouth daily.  3   lovastatin (MEVACOR) 40 MG tablet Take 20 mg by mouth every evening.  2   metoprolol  succinate (TOPROL  XL) 25 MG 24 hr tablet Take 1 tablet (25 mg total) by mouth daily. 90 tablet 3   MOUNJARO 10 MG/0.5ML Pen Inject 10 mg into the skin once  a week.     omeprazole (PRILOSEC OTC) 20 MG tablet Take 20 mg by mouth daily.     RYBELSUS 14 MG TABS Take 1 tablet by mouth every morning. (Patient not taking: Reported on 02/25/2023)     No current facility-administered medications for this visit.    Allergies:   Lisinopril, Nickel, Sunscreens, and Codeine    Social History:  The patient  reports that she quit smoking about 14 years ago. Her smoking use included cigarettes. She started smoking about 54 years ago. She has a 43 pack-year smoking history. She has never used smokeless tobacco. She reports that she does not drink alcohol and does not use drugs.   Family History:  The patient's  family history includes Alzheimer's disease in her mother; Diabetes in her maternal grandmother; Hypertension in her mother; Lung  cancer in her father; Stroke in her maternal grandmother.    ROS:  Please see the history of present illness.   Otherwise, review of systems are positive for none.   All other systems are reviewed and negative.    PHYSICAL EXAM: VS:  BP 100/62 (BP Location: Right Arm, Patient Position: Sitting, Cuff Size: Normal)   Pulse 69   Ht 5\' 6"  (1.676 m)   Wt 224 lb (101.6 kg)   SpO2 96%   BMI 36.15 kg/m  , BMI Body mass index is 36.15 kg/m. Affect appropriate Healthy:  appears stated age HEENT: normal Neck supple with no adenopathy JVP normal no bruits no thyromegaly Lungs clear with no wheezing and good diaphragmatic motion Heart:  S1/S2 no murmur, no rub, gallop or click PMI normal Abdomen: benighn, BS positve, no tenderness, no AAA no bruit.  No HSM or HJR Distal pulses intact with no bruits No edema Neuro non-focal Skin warm and dry No muscular weakness    EKG:   02/25/2023 NSR LBBB chronic    Recent Labs: No results found for requested labs within last 365 days.    Lipid Panel No results found for: "CHOL", "TRIG", "HDL", "CHOLHDL", "VLDL", "LDLCALC", "LDLDIRECT"    Wt Readings from Last 3 Encounters:  02/25/23 224 lb (101.6 kg)  01/23/22 222 lb 9.6 oz (101 kg)  07/23/21 235 lb (106.6 kg)         ASSESSMENT AND PLAN:  1.  Left bundle branch block, currently asymptomatic.  No high grade heart block Yearly ECG  2.  Essential hypertension controlled on current medications 3.  Plantar fasciitis. Continue orthotics and f/u Regal 4. Neuropathy  qhs Neurontin  stable no diabetes  5. Chol:  Continue statin labs with Dr Arlis Lakes on statin LDL 69 6. Palpitations: benign limited PVCls on holter in setting normal EF continue beta blocker Rx 7. Dyspnea: normal exam normal echo Former smoker with 43 pack year history Lung cancer Screening CT 03/27/21 ok f/u Dara Ear   8. CAD: subclinical high calcium score normal Lexiscan  myovue 07/23/21 with no ischemia   ASA, statin and beta  blocker   SL nitrol called in    Disposition:  F/u in a year     Janelle Mediate

## 2023-02-20 ENCOUNTER — Other Ambulatory Visit: Payer: Medicare Other

## 2023-02-25 ENCOUNTER — Ambulatory Visit: Payer: Medicare Other | Attending: Cardiovascular Disease | Admitting: Cardiovascular Disease

## 2023-02-25 DIAGNOSIS — I447 Left bundle-branch block, unspecified: Secondary | ICD-10-CM | POA: Insufficient documentation

## 2023-02-25 DIAGNOSIS — R931 Abnormal findings on diagnostic imaging of heart and coronary circulation: Secondary | ICD-10-CM | POA: Insufficient documentation

## 2023-02-25 DIAGNOSIS — I25119 Atherosclerotic heart disease of native coronary artery with unspecified angina pectoris: Secondary | ICD-10-CM | POA: Insufficient documentation

## 2023-02-25 NOTE — Patient Instructions (Signed)
 Medication Instructions:  Your physician recommends that you continue on your current medications as directed. Please refer to the Current Medication list given to you today.  *If you need a refill on your cardiac medications before your next appointment, please call your pharmacy*  Lab Work: If you have labs (blood work) drawn today and your tests are completely normal, you will receive your results only by: MyChart Message (if you have MyChart) OR A paper copy in the mail If you have any lab test that is abnormal or we need to change your treatment, we will call you to review the results.  Testing/Procedures: None ordered today.  Follow-Up: At [facility], you and your health needs are our priority.  As part of our continuing mission to provide you with exceptional heart care, we have created designated Provider Care Teams.  These Care Teams include your primary Cardiologist (physician) and Advanced Practice Providers (APPs -  Physician Assistants and Nurse Practitioners) who all work together to provide you with the care you need, when you need it.  We recommend signing up for the patient portal called "MyChart".  Sign up information is provided on this After Visit Summary.  MyChart is used to connect with patients for Virtual Visits (Telemedicine).  Patients are able to view lab/test results, encounter notes, upcoming appointments, etc.  Non-urgent messages can be sent to your provider as well.   To learn more about what you can do with MyChart, go to [email or web address].    Your next appointment:   1 year(s)  Provider:   Charlton Haws, MD     Other Instructions

## 2023-05-07 ENCOUNTER — Other Ambulatory Visit: Payer: Self-pay

## 2023-05-07 DIAGNOSIS — Z122 Encounter for screening for malignant neoplasm of respiratory organs: Secondary | ICD-10-CM

## 2023-05-07 DIAGNOSIS — Z87891 Personal history of nicotine dependence: Secondary | ICD-10-CM

## 2023-05-28 ENCOUNTER — Ambulatory Visit
Admission: RE | Admit: 2023-05-28 | Discharge: 2023-05-28 | Disposition: A | Discharge status: Home / Self Care | Source: Ambulatory Visit | Attending: Family Medicine | Admitting: Family Medicine

## 2023-05-28 DIAGNOSIS — Z87891 Personal history of nicotine dependence: Secondary | ICD-10-CM

## 2023-05-28 DIAGNOSIS — Z122 Encounter for screening for malignant neoplasm of respiratory organs: Secondary | ICD-10-CM | POA: Diagnosis not present

## 2023-06-10 DIAGNOSIS — R58 Hemorrhage, not elsewhere classified: Secondary | ICD-10-CM | POA: Diagnosis not present

## 2023-06-10 DIAGNOSIS — B078 Other viral warts: Secondary | ICD-10-CM | POA: Diagnosis not present

## 2023-06-26 ENCOUNTER — Other Ambulatory Visit: Payer: Self-pay

## 2023-06-26 DIAGNOSIS — Z122 Encounter for screening for malignant neoplasm of respiratory organs: Secondary | ICD-10-CM

## 2023-06-26 DIAGNOSIS — Z87891 Personal history of nicotine dependence: Secondary | ICD-10-CM

## 2023-07-15 DIAGNOSIS — I10 Essential (primary) hypertension: Secondary | ICD-10-CM | POA: Diagnosis not present

## 2023-07-15 DIAGNOSIS — E1169 Type 2 diabetes mellitus with other specified complication: Secondary | ICD-10-CM | POA: Diagnosis not present

## 2023-07-15 DIAGNOSIS — K219 Gastro-esophageal reflux disease without esophagitis: Secondary | ICD-10-CM | POA: Diagnosis not present

## 2023-07-15 DIAGNOSIS — E785 Hyperlipidemia, unspecified: Secondary | ICD-10-CM | POA: Diagnosis not present

## 2023-07-16 DIAGNOSIS — H524 Presbyopia: Secondary | ICD-10-CM | POA: Diagnosis not present

## 2023-07-16 DIAGNOSIS — H25011 Cortical age-related cataract, right eye: Secondary | ICD-10-CM | POA: Diagnosis not present

## 2023-07-16 DIAGNOSIS — H5000 Unspecified esotropia: Secondary | ICD-10-CM | POA: Diagnosis not present

## 2023-07-16 DIAGNOSIS — H2513 Age-related nuclear cataract, bilateral: Secondary | ICD-10-CM | POA: Diagnosis not present

## 2023-07-16 DIAGNOSIS — E119 Type 2 diabetes mellitus without complications: Secondary | ICD-10-CM | POA: Diagnosis not present

## 2023-07-16 DIAGNOSIS — H52203 Unspecified astigmatism, bilateral: Secondary | ICD-10-CM | POA: Diagnosis not present

## 2023-07-21 DIAGNOSIS — B078 Other viral warts: Secondary | ICD-10-CM | POA: Diagnosis not present

## 2023-07-21 DIAGNOSIS — D225 Melanocytic nevi of trunk: Secondary | ICD-10-CM | POA: Diagnosis not present

## 2023-07-21 DIAGNOSIS — D1801 Hemangioma of skin and subcutaneous tissue: Secondary | ICD-10-CM | POA: Diagnosis not present

## 2023-07-21 DIAGNOSIS — L821 Other seborrheic keratosis: Secondary | ICD-10-CM | POA: Diagnosis not present

## 2023-07-21 DIAGNOSIS — Z86018 Personal history of other benign neoplasm: Secondary | ICD-10-CM | POA: Diagnosis not present

## 2023-07-21 DIAGNOSIS — L570 Actinic keratosis: Secondary | ICD-10-CM | POA: Diagnosis not present

## 2023-07-21 DIAGNOSIS — L578 Other skin changes due to chronic exposure to nonionizing radiation: Secondary | ICD-10-CM | POA: Diagnosis not present

## 2023-09-07 ENCOUNTER — Other Ambulatory Visit: Payer: Self-pay | Admitting: Family Medicine

## 2023-09-07 DIAGNOSIS — Z1231 Encounter for screening mammogram for malignant neoplasm of breast: Secondary | ICD-10-CM

## 2023-10-20 ENCOUNTER — Ambulatory Visit
Admission: RE | Admit: 2023-10-20 | Discharge: 2023-10-20 | Disposition: A | Discharge status: Home / Self Care | Source: Ambulatory Visit | Attending: Family Medicine | Admitting: Family Medicine

## 2023-10-20 DIAGNOSIS — Z1231 Encounter for screening mammogram for malignant neoplasm of breast: Secondary | ICD-10-CM

## 2023-11-07 DIAGNOSIS — Z23 Encounter for immunization: Secondary | ICD-10-CM | POA: Diagnosis not present

## 2023-12-21 DIAGNOSIS — E1169 Type 2 diabetes mellitus with other specified complication: Secondary | ICD-10-CM | POA: Diagnosis not present

## 2023-12-21 DIAGNOSIS — I10 Essential (primary) hypertension: Secondary | ICD-10-CM | POA: Diagnosis not present

## 2023-12-21 DIAGNOSIS — K219 Gastro-esophageal reflux disease without esophagitis: Secondary | ICD-10-CM | POA: Diagnosis not present

## 2023-12-21 DIAGNOSIS — E785 Hyperlipidemia, unspecified: Secondary | ICD-10-CM | POA: Diagnosis not present

## 2024-01-22 ENCOUNTER — Encounter: Payer: Self-pay | Admitting: Cardiovascular Disease

## 2024-02-20 NOTE — Progress Notes (Unsigned)
 "    Cardiology Office Note   Date:  02/20/2024   ID:  Alicia Hogan, DOB [birth year], MRN [id number]  PCP:  Verena Mems, MD  Cardiologist: Delford       History of Present Illness: Alicia Hogan is a 74 y.o.  female who presents for f/u First seen 05/15/21 after 3 year absence Previously seen by Dr Dominick on 06/13/14 for evaluation of PVCs and a new left bundle branch block.  She underwent Lexiscan  Myoview  stress test on 06/23/14.  The ejection fraction was normal at 64% and there were no perfusion deficits.  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  She tries to walk up the stairs at the hospital for exercise.  She also walks in her neighborhood with her husband and they also enjoy dancing for exercise  Some activity limited by plantar fasciatus , neuropathy and L5/S1 disc disease with fusion by Dr Colon 03/2017  Echo: 10/10/16 reviewed Normal EF 55-60% Holter: 10/14/16 reviewed SR infrequent unifocal PVCls less than 1% of total beats  Had failed L5/S1 disectomy and needed a fusion with Dr Colon in February2019  Has 3 kids doing well She goes to gym 3x/week and spends time in [place]  TTE 05/30/21 EF 55-60% no significant valve dx Calcium Score 05/30/21 total 1387 99 th percentile LM/3 vessel Myovue 07/23/21 normal no ischemia EF 69%   She is on Mevacor with LDL 73  Some dizziness when getting up from floor with head between her legs Not postural in office By me with systolic BP 120 mmHg standing and sitting   Husband had a perforated bowel last February in [place] with prolonged recovery including colostomy with reversal and hernia repair Seeing Dr Emeline locally  ***  Past Medical History:  Diagnosis Date   Arthritis    Cancer [facility])    COPD (chronic obstructive pulmonary disease) ([facility])    from ct result   Dysrhythmia    RBB hx of   GERD (gastroesophageal reflux disease)    occ   Hypertension     Plantar fasciitis 12/28/2013    Past Surgical History:  Procedure Laterality Date   BACK SURGERY  11/2015   lumbar foraminotomy   COLONOSCOPY     HEMORRHOID SURGERY N/A 12/17/2016   Procedure: 3 COLUMN HEMORRHOIDECTOMY;  Surgeon: Debby Hila, MD;  Location: [facility];  Service: General;  Laterality: N/A;   L 5 to S 1 fusion  04/08/2016   MENISCUS REPAIR Left 2012   TONSILLECTOMY  as child   and adenoidectomy     Current Outpatient Medications  Medication Sig Dispense Refill   aspirin 81 MG tablet Take 81 mg by mouth daily.     betamethasone dipropionate 0.05 % cream Apply topically as needed.     gabapentin  (NEURONTIN ) 300 MG capsule Take by mouth. Take 300 mg in at 4 pm and 600 mg at bedtime by mouth     hydrochlorothiazide (HYDRODIURIL) 25 MG tablet Take 25 mg by mouth every morning.  11   losartan  (COZAAR ) 100 MG tablet Take 100 mg by mouth daily.  3   lovastatin (MEVACOR) 40 MG tablet Take 20 mg by mouth every evening.  2   metoprolol  succinate (TOPROL  XL) 25 MG 24 hr tablet Take 1 tablet (25 mg total) by mouth daily. 90 tablet 3   MOUNJARO 10 MG/0.5ML Pen Inject 10 mg  into the skin once a week.     omeprazole (PRILOSEC OTC) 20 MG tablet Take 20 mg by mouth daily.     RYBELSUS 14 MG TABS Take 1 tablet by mouth every morning. (Patient not taking: Reported on 02/25/2023)     No current facility-administered medications for this visit.    Allergies:   Lisinopril, Nickel, Sunscreens, and Codeine    Social History:  The patient  reports that she quit smoking about 15 years ago. Her smoking use included cigarettes. She started smoking about 55 years ago. She has a 43 pack-year smoking history. She has never used smokeless tobacco. She reports that she does not drink alcohol and does not use drugs.   Family History:  The patient's  family history includes Alzheimer's disease in her mother; Diabetes in her maternal grandmother; Hypertension in her mother; Lung  cancer in her father; Stroke in her maternal grandmother.    ROS:  Please see the history of present illness.   Otherwise, review of systems are positive for none.   All other systems are reviewed and negative.    PHYSICAL EXAM: VS:  There were no vitals taken for this visit. , BMI There is no height or weight on file to calculate BMI. Affect appropriate Healthy:  appears stated age HEENT: normal Neck supple with no adenopathy JVP normal no bruits no thyromegaly Lungs clear with no wheezing and good diaphragmatic motion Heart:  S1/S2 no murmur, no rub, gallop or click PMI normal Abdomen: benighn, BS positve, no tenderness, no AAA no bruit.  No HSM or HJR Distal pulses intact with no bruits No edema Neuro non-focal Skin warm and dry No muscular weakness    EKG:   02/20/2024 NSR LBBB chronic    Recent Labs: No results found for requested labs within last 365 days.    Lipid Panel No results found for: CHOL, TRIG, HDL, CHOLHDL, VLDL, LDLCALC, LDLDIRECT    Wt Readings from Last 3 Encounters:  02/25/23 101.6 kg  01/23/22 101 kg  07/23/21 106.6 kg         ASSESSMENT AND PLAN:  1.  Left bundle branch block, currently asymptomatic.  No high grade heart block Yearly ECG  2.  Essential hypertension controlled on current medications 3.  Plantar fasciitis. Continue orthotics and f/u Regal 4. Neuropathy  qhs Neurontin  stable no diabetes  5. Chol:  Continue statin labs with Dr Douglass on statin LDL 69 6. Palpitations: benign limited PVCls on holter in setting normal EF continue beta blocker Rx 7. Dyspnea: normal exam normal echo Former smoker with 43 pack year history Lung cancer Screening CT 06/26/23  ok f/u Lauraine Lites   8. CAD: subclinical high calcium score normal Lexiscan  myovue 07/23/21 with no ischemia   ASA, statin and beta blocker   SL nitrol called in    Disposition:  F/u in a year     Maude Emmer "

## 2024-03-04 ENCOUNTER — Ambulatory Visit: Admitting: Cardiovascular Disease

## 2024-04-05 ENCOUNTER — Ambulatory Visit: Admitting: Cardiovascular Disease

## 2024-05-24 ENCOUNTER — Ambulatory Visit: Admitting: Cardiovascular Disease
